# Patient Record
Sex: Female | Born: 1965
Health system: Southern US, Community
[De-identification: ages and names within clinical notes are randomized; demographics above are authoritative.]

## PROBLEM LIST (undated history)

## (undated) DIAGNOSIS — R05 Cough: Secondary | ICD-10-CM

## (undated) DIAGNOSIS — F1010 Alcohol abuse, uncomplicated: Secondary | ICD-10-CM

## (undated) DIAGNOSIS — F32A Depression, unspecified: Secondary | ICD-10-CM

## (undated) DIAGNOSIS — F419 Anxiety disorder, unspecified: Secondary | ICD-10-CM

## (undated) DIAGNOSIS — N200 Calculus of kidney: Secondary | ICD-10-CM

## (undated) DIAGNOSIS — J984 Other disorders of lung: Secondary | ICD-10-CM

## (undated) DIAGNOSIS — M1990 Unspecified osteoarthritis, unspecified site: Secondary | ICD-10-CM

## (undated) HISTORY — PX: RHINOPLASTY: SUR1284

## (undated) HISTORY — PX: NASAL SEPTUM SURGERY: SHX37

## (undated) HISTORY — PX: COLONOSCOPY: SHX174

## (undated) HISTORY — DX: Alcohol abuse, uncomplicated: F10.10

## (undated) HISTORY — DX: Anxiety disorder, unspecified: F41.9

## (undated) HISTORY — DX: Cough: R05

## (undated) HISTORY — DX: Other disorders of lung: J98.4

## (undated) HISTORY — DX: Unspecified osteoarthritis, unspecified site: M19.90

## (undated) HISTORY — DX: Calculus of kidney: N20.0

## (undated) HISTORY — DX: Depression, unspecified: F32.A

---

## 2000-03-19 ENCOUNTER — Other Ambulatory Visit: Admission: RE | Admit: 2000-03-19 | Discharge: 2000-03-19 | Payer: Self-pay | Admitting: Obstetrics & Gynecology

## 2001-01-28 ENCOUNTER — Encounter: Payer: Self-pay | Admitting: Family Medicine

## 2001-01-28 ENCOUNTER — Encounter: Admission: RE | Admit: 2001-01-28 | Discharge: 2001-01-28 | Payer: Self-pay | Admitting: Family Medicine

## 2001-05-04 ENCOUNTER — Other Ambulatory Visit: Admission: RE | Admit: 2001-05-04 | Discharge: 2001-05-04 | Payer: Self-pay | Admitting: Obstetrics and Gynecology

## 2001-05-16 ENCOUNTER — Encounter: Payer: Self-pay | Admitting: Emergency Medicine

## 2001-05-16 ENCOUNTER — Emergency Department (HOSPITAL_COMMUNITY): Admission: EM | Admit: 2001-05-16 | Discharge: 2001-05-16 | Payer: Self-pay | Admitting: Emergency Medicine

## 2002-05-26 ENCOUNTER — Other Ambulatory Visit: Admission: RE | Admit: 2002-05-26 | Discharge: 2002-05-26 | Payer: Self-pay | Admitting: Obstetrics & Gynecology

## 2003-10-21 ENCOUNTER — Other Ambulatory Visit: Admission: RE | Admit: 2003-10-21 | Discharge: 2003-10-21 | Payer: Self-pay | Admitting: Obstetrics and Gynecology

## 2004-01-27 ENCOUNTER — Other Ambulatory Visit: Admission: RE | Admit: 2004-01-27 | Discharge: 2004-01-27 | Payer: Self-pay | Admitting: Obstetrics & Gynecology

## 2004-07-25 ENCOUNTER — Other Ambulatory Visit: Admission: RE | Admit: 2004-07-25 | Discharge: 2004-07-25 | Payer: Self-pay | Admitting: Obstetrics and Gynecology

## 2005-01-04 ENCOUNTER — Ambulatory Visit: Payer: Self-pay | Admitting: Family Medicine

## 2005-02-05 ENCOUNTER — Other Ambulatory Visit: Admission: RE | Admit: 2005-02-05 | Discharge: 2005-02-05 | Payer: Self-pay | Admitting: Obstetrics & Gynecology

## 2005-09-24 ENCOUNTER — Ambulatory Visit: Payer: Self-pay | Admitting: Family Medicine

## 2006-02-03 ENCOUNTER — Ambulatory Visit: Payer: Self-pay | Admitting: Family Medicine

## 2006-10-13 ENCOUNTER — Telehealth (INDEPENDENT_AMBULATORY_CARE_PROVIDER_SITE_OTHER): Payer: Self-pay | Admitting: *Deleted

## 2007-03-25 ENCOUNTER — Ambulatory Visit: Payer: Self-pay | Admitting: Family Medicine

## 2007-06-24 ENCOUNTER — Ambulatory Visit: Payer: Self-pay | Admitting: Family Medicine

## 2007-06-29 ENCOUNTER — Telehealth: Payer: Self-pay | Admitting: Family Medicine

## 2007-07-01 LAB — CONVERTED CEMR LAB
ALT: 11 units/L (ref 0–35)
AST: 13 units/L (ref 0–37)
Albumin: 4.3 g/dL (ref 3.5–5.2)
Alkaline Phosphatase: 44 units/L (ref 39–117)
BUN: 13 mg/dL (ref 6–23)
CO2: 22 meq/L (ref 19–32)
Calcium: 9.3 mg/dL (ref 8.4–10.5)
Chloride: 106 meq/L (ref 96–112)
Cholesterol: 225 mg/dL — ABNORMAL HIGH (ref 0–200)
Creatinine, Ser: 0.68 mg/dL (ref 0.40–1.20)
Glucose, Bld: 90 mg/dL (ref 70–99)
HDL: 65 mg/dL (ref >39)
LDL Cholesterol: 101 mg/dL — ABNORMAL HIGH (ref 0–99)
Potassium: 4.4 meq/L (ref 3.5–5.3)
Sodium: 140 meq/L (ref 135–145)
TSH: 3.36 microintl units/mL (ref 0.350–5.50)
Tissue Transglutaminase Ab, IgA: 0.2 units (ref <7)
Total Bilirubin: 0.4 mg/dL (ref 0.3–1.2)
Total CHOL/HDL Ratio: 3.5
Total Protein: 6.9 g/dL (ref 6.0–8.3)
Triglycerides: 296 mg/dL — ABNORMAL HIGH (ref <150)
VLDL: 59 mg/dL — ABNORMAL HIGH (ref 0–40)

## 2007-07-30 ENCOUNTER — Telehealth: Payer: Self-pay | Admitting: Family Medicine

## 2007-08-07 ENCOUNTER — Ambulatory Visit: Payer: Self-pay | Admitting: Gastroenterology

## 2007-08-25 ENCOUNTER — Encounter: Payer: Self-pay | Admitting: Gastroenterology

## 2007-08-25 ENCOUNTER — Ambulatory Visit: Payer: Self-pay | Admitting: Gastroenterology

## 2007-08-28 ENCOUNTER — Encounter: Payer: Self-pay | Admitting: Gastroenterology

## 2007-12-08 ENCOUNTER — Ambulatory Visit: Payer: Self-pay | Admitting: Family Medicine

## 2007-12-08 ENCOUNTER — Encounter: Admission: RE | Admit: 2007-12-08 | Discharge: 2007-12-08 | Payer: Self-pay | Admitting: Family Medicine

## 2007-12-08 DIAGNOSIS — M25559 Pain in unspecified hip: Secondary | ICD-10-CM | POA: Insufficient documentation

## 2008-04-08 ENCOUNTER — Ambulatory Visit: Payer: Self-pay | Admitting: Family Medicine

## 2008-04-08 DIAGNOSIS — R21 Rash and other nonspecific skin eruption: Secondary | ICD-10-CM

## 2008-04-09 ENCOUNTER — Encounter: Payer: Self-pay | Admitting: Family Medicine

## 2008-04-15 ENCOUNTER — Telehealth: Payer: Self-pay | Admitting: Family Medicine

## 2008-04-25 ENCOUNTER — Telehealth: Payer: Self-pay | Admitting: Family Medicine

## 2008-04-28 ENCOUNTER — Ambulatory Visit: Payer: Self-pay | Admitting: Family Medicine

## 2008-04-28 LAB — CONVERTED CEMR LAB
Basophils Absolute: 0 10*3/uL (ref 0.0–0.1)
Eosinophils Relative: 0 % (ref 0–5)
HCT: 36.5 % (ref 36.0–46.0)
Lymphocytes Relative: 16 % (ref 12–46)
Neutrophils Relative %: 76 % (ref 43–77)
Platelets: 182 10*3/uL (ref 150–400)
RDW: 12.5 % (ref 11.5–15.5)
WBC: 6.5 10*3/uL (ref 4.0–10.5)

## 2008-05-02 ENCOUNTER — Telehealth: Payer: Self-pay | Admitting: Family Medicine

## 2008-06-10 ENCOUNTER — Telehealth (INDEPENDENT_AMBULATORY_CARE_PROVIDER_SITE_OTHER): Payer: Self-pay | Admitting: *Deleted

## 2008-06-13 ENCOUNTER — Ambulatory Visit: Payer: Self-pay | Admitting: Family Medicine

## 2008-06-13 DIAGNOSIS — F1010 Alcohol abuse, uncomplicated: Secondary | ICD-10-CM

## 2008-06-13 HISTORY — DX: Alcohol abuse, uncomplicated: F10.10

## 2008-06-14 ENCOUNTER — Telehealth: Payer: Self-pay | Admitting: Family Medicine

## 2008-06-16 ENCOUNTER — Telehealth: Payer: Self-pay | Admitting: Family Medicine

## 2008-07-08 ENCOUNTER — Encounter: Admission: RE | Admit: 2008-07-08 | Discharge: 2008-07-08 | Payer: Self-pay | Admitting: Family Medicine

## 2008-07-08 ENCOUNTER — Ambulatory Visit: Payer: Self-pay | Admitting: Family Medicine

## 2008-07-08 DIAGNOSIS — R05 Cough: Secondary | ICD-10-CM

## 2008-07-08 DIAGNOSIS — R059 Cough, unspecified: Secondary | ICD-10-CM

## 2008-07-08 HISTORY — DX: Cough, unspecified: R05.9

## 2008-07-10 DIAGNOSIS — J984 Other disorders of lung: Secondary | ICD-10-CM

## 2008-07-10 HISTORY — DX: Other disorders of lung: J98.4

## 2008-07-13 ENCOUNTER — Encounter: Admission: RE | Admit: 2008-07-13 | Discharge: 2008-07-13 | Payer: Self-pay | Admitting: Family Medicine

## 2009-02-20 ENCOUNTER — Ambulatory Visit: Payer: Self-pay | Admitting: Family Medicine

## 2009-02-20 DIAGNOSIS — M654 Radial styloid tenosynovitis [de Quervain]: Secondary | ICD-10-CM

## 2009-11-09 ENCOUNTER — Ambulatory Visit: Payer: Self-pay | Admitting: Family Medicine

## 2009-11-09 DIAGNOSIS — M545 Low back pain, unspecified: Secondary | ICD-10-CM | POA: Insufficient documentation

## 2009-11-10 ENCOUNTER — Encounter: Payer: Self-pay | Admitting: Family Medicine

## 2010-03-20 NOTE — Assessment & Plan Note (Signed)
Summary: BAck pain, skin lesion   Vital Signs:  Patient profile:   45 year old female Height:      63 inches Weight:      157 pounds Pulse rate:   83 / minute BP sitting:   121 / 79  (right arm) Cuff size:   regular  Vitals Entered By: Avon Gully CMA, Duncan Dull) (November 09, 2009 10:57 AM) CC: back pain since this am, did some heavy lifting yesterday, spot on the rt dorsum hand x 1 1/2 month itches,denies bleeding has grown bigger   Primary Care Vartan Kerins:  Dr. Nani Gasser  CC:  back pain since this am, did some heavy lifting yesterday, spot on the rt dorsum hand x 1 1/2 month itches, and denies bleeding has grown bigger.  History of Present Illness: back pain since this am, did some heavy lifting yesterday,   spot on the rt dorsum hand x 1 1/2 month itches,denies bleeding has grown bigger. No alleviating or worsening sxs. Hasn't tried any treatments.   Lifting heavy things yesterday. Woke up sore this AM. Pain in the low back. Taking 2 Aleve. Throbbing.  No spasms. No radiation of pain. No old back injuries. This was at home. No fever or urinary sxs. Was with prolonged sitting or standing. No real alleviating sxs.   Current Medications (verified): 1)  Xanax 0.5 Mg  Tabs (Alprazolam) .... Up To Three Times A Day Prn 2)  Ambien Cr 12.5 Mg  Tbcr (Zolpidem Tartrate) .... Take 1 Tab By Mouth At Bedtime 3)  Caltrate 600 1500 Mg  Tabs (Calcium Carbonate) .... Take 1 Tablet By Mouth Once A Day 4)  Angeliq 0.5-1 Mg  Tabs (Drospirenone-Estradiol) .... Take One Tablet By Mouth Once A Day  Allergies (verified): No Known Drug Allergies  Comments:  Nurse/Medical Assistant: The patient's medications and allergies were reviewed with the patient and were updated in the Medication and Allergy Lists. Avon Gully CMA, Duncan Dull) (November 09, 2009 10:58 AM)  Past History:  Past Medical History: Last updated: 06/13/2008 childbirth x 1 Anxiety Disorder insomnia alcohol abuse.     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  Married to [employer] with 3 children.  the 2 kids at home are stepchildren.   Former Smoker- quit 2007 Alcohol use-yes, 2 glasses of wine a day Drug use-no Regular exercise-yes  Physical Exam  General:  Well-developed,well-nourished,in no acute distress; alert,appropriate and cooperative throughout examination Lungs:  Normal respiratory effort, chest expands symmetrically. Lungs are clear to auscultation, no crackles or wheezes. Heart:  Normal rate and regular rhythm. S1 and S2 normal without gallop, murmur, click, rub or other extra sounds. Msk:  Dec flexion. Normal extension. Normal rotation right and left with side bendind.  Mildy tender over lumbar spine and lumbar paraspinous muscles. No SI joint tenderness.  Skin:  Left dorsume of hand wiht a 1 cm pink papular lesion. well demarcated. No scale or breaks in the skin. Smooth lesion.    Impression & Recommendations:  Problem # 1:  BACK PAIN, LUMBAR (ICD-724.2) Dsicussed likely muscle spasm.  Tral of Aleve, muscle relaxer and as needed tramadol. Als given HO on low back exercises.. If not better in 2-3 weeks f/u in office.  Her updated medication list for this problem includes:    Skelaxin 800 Mg Tabs (Metaxalone) .Marland Kitchen... Take 1 tablet by mouth three times a day as needed muscle spasm.    Tramadol Hcl 50 Mg Tabs (Tramadol hcl) .Marland Kitchen... Take 1  tablet by mouth three times a day as needed for severe pain  Problem # 2:  SKIN RASH (ICD-782.1) KOH done today to rule out fungal cause. If neg treat with topical steoroid adn if not resolving in 2 weeks rec bx.  Orders: T-KOH Prep Fungal ([id number])  Complete Medication List: 1)  Xanax 0.5 Mg Tabs (Alprazolam) .... Up to three times a day prn 2)  Ambien Cr 12.5 Mg Tbcr (Zolpidem tartrate) .... Take 1 tab by mouth at bedtime 3)  Caltrate 600 1500 Mg Tabs (Calcium carbonate) .... Take 1 tablet by mouth once a  day 4)  Angeliq 0.5-1 Mg Tabs (Drospirenone-estradiol) .... Take one tablet by mouth once a day 5)  Skelaxin 800 Mg Tabs (Metaxalone) .... Take 1 tablet by mouth three times a day as needed muscle spasm. 6)  Tramadol Hcl 50 Mg Tabs (Tramadol hcl) .... Take 1 tablet by mouth three times a day as needed for severe pain  Patient Instructions: 1)  Start exercises this weekend 2)  Can use the muslce relaxer and tramadol for pain but still continue your aleve 2 tab by mouth two times a day with food for inflammation. Call if not better in 2-3 weeks.  Prescriptions: TRAMADOL HCL 50 MG TABS (TRAMADOL HCL) Take 1 tablet by mouth three times a day as needed for severe pain  #45 x 0   Entered and Authorized by:   Nani Gasser MD   Signed by:   Nani Gasser MD on 11/09/2009   Method used:   Electronically to        [id number]* (retail)       [address]       [place], [place]  [address]       Ph: [phone]       Fax: [phone]   RxID:   [id number] SKELAXIN 800 MG TABS (METAXALONE) Take 1 tablet by mouth three times a day as needed muscle spasm.  #30 x 0   Entered and Authorized by:   Nani Gasser MD   Signed by:   Nani Gasser MD on 11/09/2009   Method used:   Electronically to        [id number]* (retail)       [address]       [place], [place]  [address]       Ph: [phone]       Fax: [phone]   RxID:   [id number]

## 2010-03-20 NOTE — Assessment & Plan Note (Signed)
Summary: URI, Dequervains TEnosynovitis   Vital Signs:  Patient profile:   45 year old female Height:      63 inches Weight:      159 pounds Temp:     98.5 degrees F oral Pulse rate:   80 / minute BP sitting:   114 / 73  (left arm) Cuff size:   large  Vitals Entered By: Kathlene November (February 20, 2009 4:17 PM) CC: sore throat and dry cough since Friday   Primary Care Provider:  Dr. Nani Gasser  CC:  sore throat and dry cough since Friday.  History of Present Illness: sore throat and dry cough since Friday.  No fever or sinus sxs.  No GI sxs. ST is maybe a little better today.  No SOB.  Has tried some nasal saline. Went home from work early today.  No ansal congestion.   Right thumb pain for about 1 month., Previously dx wiht Dequervains on the left and has had a steroid injection. Wears her splint occ but now with signs on the right. Feel exactly the same. Worse if reaches out to lift something and pain is on the wrist at the radial side. No swelling or redness.    Current Medications (verified): 1)  Xanax 0.5 Mg  Tabs (Alprazolam) .... Up To Three Times A Day Prn 2)  Ambien Cr 12.5 Mg  Tbcr (Zolpidem Tartrate) .... Take 1 Tab By Mouth At Bedtime 3)  Caltrate 600 1500 Mg  Tabs (Calcium Carbonate) .... Take 1 Tablet By Mouth Once A Day 4)  Angeliq 0.5-1 Mg  Tabs (Drospirenone-Estradiol) .... Take One Tablet By Mouth Once A Day  Allergies (verified): No Known Drug Allergies  Comments:  Nurse/Medical Assistant: The patient's medications and allergies were reviewed with the patient and were updated in the Medication and Allergy Lists. Kathlene November (February 20, 2009 4:18 PM)  Physical Exam  General:  Well-developed,well-nourished,in no acute distress; alert,appropriate and cooperative throughout examination Head:  Normocephalic and atraumatic without obvious abnormalities. No apparent alopecia or balding. Eyes:  No corneal or conjunctival inflammation noted. EOMI. Perrla.    Ears:  External ear exam shows no significant lesions or deformities.  Otoscopic examination reveals clear canals, tympanic membranes are intact bilaterally without bulging, retraction, inflammation or discharge. Hearing is grossly normal bilaterally. Nose:  External nasal examination shows no deformity or inflammation. Nasal mucosa are pink and moist without lesions or exudates. Mouth:  Oral mucosa and oropharynx without lesions or exudates.  Teeth in good repair. Neck:  No deformities, masses, or tenderness noted. Lungs:  Normal respiratory effort, chest expands symmetrically. Lungs are clear to auscultation, no crackles or wheezes. Heart:  Normal rate and regular rhythm. S1 and S2 normal without gallop, murmur, click, rub or other extra sounds. Msk:  Left wrist with NROM. Pain with dequervains sign.  Tnder over the radial aspect of the wrist.  Sterngth 5/5 in teh thumb. Nontender in the other thumb joints.  Pulses:  Radial 2+ on the left.  Skin:  no rashes.   Cervical Nodes:  No lymphadenopathy noted Psych:  Cognition and judgment appear intact. Alert and cooperative with normal attention span and concentration. No apparent delusions, illusions, hallucinations   Impression & Recommendations:  Problem # 1:  URI (ICD-465.9) sterp neg. Call if not better in one week and will terat with an ABX.   Orders: Rapid Strep ([id number])  Problem # 2:  DE QUERVAIN'S TENOSYNOVITIS (ICD-727.04) Since has had signs in both hands  can consider checking for inflammation, gout, and thyroid d/o.  PT can go to the lab any time.  Injected for pain relief today and splint given. Wear splint consistantly for 2 weeks. If not improvin then please call and will refer to ortho.  Orders: T-TSH [phone]) T-Sed Rate (Automated) [phone]) T-Uric Acid (Blood) [phone]) Wrist/Thumb Spica (V7846) Injection, Tendon / Ligament ([id number])  Complete Medication List: 1)  Xanax 0.5 Mg Tabs (Alprazolam) .... Up to three  times a day prn 2)  Ambien Cr 12.5 Mg Tbcr (Zolpidem tartrate) .... Take 1 tab by mouth at bedtime 3)  Caltrate 600 1500 Mg Tabs (Calcium carbonate) .... Take 1 tablet by mouth once a day 4)  Angeliq 0.5-1 Mg Tabs (Drospirenone-estradiol) .... Take one tablet by mouth once a day   Procedure Note  Injections:    Comment: Lidocaine 1% w/o epi [id number]  Exp. 12/2010 Kenalog40mg  M#[id number]  Exp. 3/12  Laboratory Results  Date/Time Received: 02/20/2009 Date/Time Reported: 02/20/2009  Other Tests  Rapid Strep: negative

## 2010-04-26 ENCOUNTER — Encounter: Payer: Self-pay | Admitting: Family Medicine

## 2010-04-26 ENCOUNTER — Ambulatory Visit (INDEPENDENT_AMBULATORY_CARE_PROVIDER_SITE_OTHER): Payer: Private Health Insurance - Indemnity | Admitting: Family Medicine

## 2010-04-26 DIAGNOSIS — R197 Diarrhea, unspecified: Secondary | ICD-10-CM | POA: Insufficient documentation

## 2010-04-27 LAB — CONVERTED CEMR LAB
AST: 15 units/L (ref 0–37)
Albumin: 4.7 g/dL (ref 3.5–5.2)
Alkaline Phosphatase: 62 units/L (ref 39–117)
Basophils Relative: 0 % (ref 0–1)
Eosinophils Absolute: 0.4 10*3/uL (ref 0.0–0.7)
Lymphs Abs: 2.1 10*3/uL (ref 0.7–4.0)
MCHC: 33.3 g/dL (ref 30.0–36.0)
MCV: 102.5 fL — ABNORMAL HIGH (ref 78.0–100.0)
Neutro Abs: 9.1 10*3/uL — ABNORMAL HIGH (ref 1.7–7.7)
Neutrophils Relative %: 73 % (ref 43–77)
Platelets: 310 10*3/uL (ref 150–400)
Potassium: 4.2 meq/L (ref 3.5–5.3)
Sodium: 139 meq/L (ref 135–145)
Total Protein: 7.1 g/dL (ref 6.0–8.3)
WBC: 12.5 10*3/uL — ABNORMAL HIGH (ref 4.0–10.5)

## 2010-04-27 LAB — CBC WITH DIFFERENTIAL/PLATELET
Eosinophils Relative: 3 % (ref 0–5)
HCT: 41.1 % (ref 36.0–46.0)
Hemoglobin: 13.7 g/dL (ref 12.0–15.0)
Lymphocytes Relative: 17 % (ref 12–46)
Lymphs Abs: 2.1 10*3/uL (ref 0.7–4.0)
MCV: 102.5 fL — ABNORMAL HIGH (ref 78.0–100.0)
Monocytes Absolute: 0.9 10*3/uL (ref 0.1–1.0)
Monocytes Relative: 7 % (ref 3–12)
RBC: 4.01 MIL/uL (ref 3.87–5.11)
WBC: 12.5 10*3/uL — ABNORMAL HIGH (ref 4.0–10.5)

## 2010-04-27 LAB — COMPREHENSIVE METABOLIC PANEL
ALT: 11 U/L (ref 0–35)
CO2: 18 mEq/L — ABNORMAL LOW (ref 19–32)
Calcium: 9.7 mg/dL (ref 8.4–10.5)
Chloride: 104 mEq/L (ref 96–112)
Creat: 0.7 mg/dL (ref 0.40–1.20)

## 2010-04-27 LAB — LIPASE: Lipase: 13 U/L (ref 0–75)

## 2010-04-27 LAB — GLIADIN ANTIBODIES, SERUM: Gliadin IgG: 3.7 U/mL (ref <20)

## 2010-04-27 LAB — AMYLASE: Amylase: 21 U/L (ref 0–105)

## 2010-04-30 ENCOUNTER — Encounter: Payer: Self-pay | Admitting: Family Medicine

## 2010-05-01 NOTE — Assessment & Plan Note (Signed)
Summary:  chronic diarrhea   Vital Signs:  Patient profile:   45 year old female Height:      63 inches Weight:      154 pounds Pulse rate:   86 / minute BP sitting:   125 / 83  (right arm) Cuff size:   regular  Vitals Entered By: Avon Gully CMA, Duncan Dull) (April 26, 2010 3:09 PM) CC: constant diarrhea   Primary Care Provider:  Dr. Nani Gasser  CC:  constant diarrhea.  History of Present Illness: Havinga a colonoscopy  in 2009 that was normal.  Having constant diarrhea for several years. Feels like she eats adn then has to have a BM.  Never constipated. Occ cup of coffe in the AM.  Wine 2-3 gasses at night.  Has tried immodium AD daily.  this has not been helping she is not currently using any other medications such as Pepto-Bismol.  No blood int eh stool. Has about 7-10 bowel  movements a day.   there mostly loose occasionally watery. she must never has normal stools and denies ever having constipation. she says sometimes the diarrhea so bad that she actually limits a day or 2 of work. Infection is missed the last 2 days of work. she does not have any significant abdominal pain with this. She does get some occasional bloating. she does occasionally drink coffee but not on a daily basis. No other caffeine intake. She denies taking any NSAIDs on a regular basis no prior history of pancreatitis. She does drink 3 glasses of wine in the evening. she does have her gallbladder. no family history of inflammatory bowel disease. note her father does have a history of colon cancer at age 21.  Current Medications (verified): 1)  Xanax 0.5 Mg  Tabs (Alprazolam) .... Up To Three Times A Day Prn 2)  Ambien Cr 12.5 Mg  Tbcr (Zolpidem Tartrate) .... Take 1 Tab By Mouth At Bedtime 3)  Caltrate 600 1500 Mg  Tabs (Calcium Carbonate) .... Take 1 Tablet By Mouth Once A Day 4)  Angeliq 0.5-1 Mg  Tabs (Drospirenone-Estradiol) .... Take One Tablet By Mouth Once A Day  Allergies (verified): No Known  Drug Allergies  Comments:  Nurse/Medical Assistant: The patient's medications and allergies were reviewed with the patient and were updated in the Medication and Allergy Lists. 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  the 2 kids at home are stepchildren.   Former Smoker- quit 2007 Alcohol use-yes, 2 glasses of wine a day Drug use-no Regular exercise-yes  Physical Exam  General:  Well-developed,well-nourished,in no acute distress; alert,appropriate and cooperative throughout examination Head:  Normocephalic and atraumatic without obvious abnormalities. No apparent alopecia or balding. Heart:  Normal rate and regular rhythm. S1 and S2 normal without gallop, murmur, click, rub or other extra sounds. Abdomen:  Bowel sounds positive,abdomen soft and non-tender without masses, organomegaly or hernias noted.   Impression & Recommendations:  Problem # 1:  DIARRHEA (ICD-787.91)  she does have chronic diarrhea. I did  give her prescription for Lomotil but explained this cannot be used on a daily basis. It can be used when necessary. We will certainly check her thyroid and rule out hyper thyroidism. I will also check a CBC and make sure there is no sign of anemia or blood loss. I would also like to check an amylase and lipase may choose to normal. She could have low-grade chronic pancreatitis but she does not have any significant abdominal pain. I will also check for creatinine antibiotics to rule out  gluten sensitivity as well as lactose intolerance. If these are negative she could certainly consider a trial of lactose-free or gluten free diet for 3-4 weeks at a time to see if this significantly improves her symptoms. the other option would be for her to go back and see gastroenterology for another evaluation Her updated medication list for this problem includes:    Diphenoxylate-atropine 2.5-0.025 Mg Tabs (Diphenoxylate-atropine) .Marland Kitchen... 1-2 tabs by mouth up to two times a day  Orders: T-TSH ([id number]) T-CBC w/Diff [phone]) T-Amylase [phone]) T-Lipase [phone]) T-Gliadin Peptide Antibodies, IgG, LgA ([id number]) T- * Misc. Laboratory test [id number]) T-Comprehensive Metabolic Panel [phone])  Complete Medication List: 1)  Xanax 0.5 Mg Tabs (Alprazolam) .... Up to three times a day prn 2)  Ambien Cr 12.5 Mg Tbcr (Zolpidem tartrate) .... Take 1 tab by mouth at bedtime 3)  Caltrate 600 1500 Mg Tabs (Calcium carbonate) .... Take 1 tablet by mouth once a day 4)  Angeliq 0.5-1 Mg Tabs (Drospirenone-estradiol) .... Take one tablet by mouth once a day 5)  Diphenoxylate-atropine 2.5-0.025 Mg Tabs (Diphenoxylate-atropine) .Marland Kitchen.. 1-2 tabs by mouth up to two times a day  Patient Instructions: 1)  We wil call you with our labs.  Prescriptions: DIPHENOXYLATE-ATROPINE 2.5-0.025 MG TABS (DIPHENOXYLATE-ATROPINE) 1-2 tabs by mouth up to two times a day  #20 x 1   Entered and Authorized by:   Nani Gasser MD   Signed by:   Nani Gasser MD on 04/26/2010   Method used:   Printed then faxed to ...       [facility]* (retail)       [address] [place], [place]  [address]       Ph: [phone]       Fax: [phone]   RxID:   [id number]    Orders Added: 1)  T-TSH [phone] 2)  T-CBC w/Diff [[id number]] 3)  T-Amylase [[id number]] 4)  T-Lipase [[id number]] 5)  T-Gliadin Peptide Antibodies, IgG, LgA [[id number]] 6)  T- * Misc. Laboratory test  [99999] 7)  T-Comprehensive Metabolic Panel [[id number]] 8)  Est. Patient Level IV [61607]

## 2010-08-06 ENCOUNTER — Telehealth: Payer: Self-pay | Admitting: Family Medicine

## 2010-08-06 NOTE — Telephone Encounter (Signed)
Then needs to have dentist call in pain med until can get appt.

## 2010-08-06 NOTE — Telephone Encounter (Signed)
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 Wants vicodin called to her pharm [phone]. Her # [id number] or [id number]. Her [place] this [place]. And made an appt w/the other dentist.

## 2010-08-07 NOTE — Telephone Encounter (Signed)
Advised pt of rec. Pt agreed

## 2010-08-27 ENCOUNTER — Telehealth: Payer: Self-pay | Admitting: Family Medicine

## 2010-08-27 NOTE — Telephone Encounter (Signed)
Call pt: Neg for mild allergy adn negative for celiac dz. Can still try a gluten free diet for one month and see if feel better.  Some people do have it but test negative so can do a trial diet.

## 2010-08-28 NOTE — Telephone Encounter (Signed)
Advised pt of results and rec. 

## 2011-07-01 ENCOUNTER — Other Ambulatory Visit: Payer: Self-pay | Admitting: Obstetrics & Gynecology

## 2011-07-01 DIAGNOSIS — R928 Other abnormal and inconclusive findings on diagnostic imaging of breast: Secondary | ICD-10-CM

## 2011-07-08 ENCOUNTER — Ambulatory Visit
Admission: RE | Admit: 2011-07-08 | Discharge: 2011-07-08 | Disposition: A | Discharge status: Home / Self Care | Payer: Private Health Insurance - Indemnity | Source: Ambulatory Visit | Attending: Obstetrics & Gynecology | Admitting: Obstetrics & Gynecology

## 2011-07-08 DIAGNOSIS — R928 Other abnormal and inconclusive findings on diagnostic imaging of breast: Secondary | ICD-10-CM

## 2011-11-11 ENCOUNTER — Other Ambulatory Visit: Payer: Self-pay | Admitting: Obstetrics and Gynecology

## 2011-11-11 LAB — HM PAP SMEAR: HM Pap smear: NEGATIVE

## 2012-02-26 ENCOUNTER — Ambulatory Visit (INDEPENDENT_AMBULATORY_CARE_PROVIDER_SITE_OTHER): Payer: Private Health Insurance - Indemnity | Admitting: Family Medicine

## 2012-02-26 ENCOUNTER — Encounter: Payer: Self-pay | Admitting: Family Medicine

## 2012-02-26 VITALS — BP 120/85 | HR 89 | Temp 98.1°F | Wt 157.0 lb

## 2012-02-26 DIAGNOSIS — H6092 Unspecified otitis externa, left ear: Secondary | ICD-10-CM

## 2012-02-26 DIAGNOSIS — H60399 Other infective otitis externa, unspecified ear: Secondary | ICD-10-CM

## 2012-02-26 MED ORDER — NEOMYCIN-POLYMYXIN-HC 3.5-10000-1 OT SOLN: 3.0000 [drp] | Freq: Three times a day (TID) | OTIC | Status: AC

## 2012-02-26 NOTE — Progress Notes (Signed)
CC: Martha Richardson is a 47 y.o. female is here for left ear pain   Subjective: HPI:  5 days left ear pain. Worsening on a daily basis. Described as "annoying "in severity. Radiating down the left lateral neck, and just above the left ear. Worse with opening and closing the mouth. Nothing seems to make it better or worse otherwise. Denies drainage, hearing loss, dizziness. Denies insertion of foreign body recently or remotely. Has had some mild nasal congestion no other respiratory complaints. Has had some tenderness in the left neck to palpation. Denies trouble swallowing. Denies recent or remote trauma. No interventions as of yet. Denies fevers, chills, cough, skin changes, scalp changes/discomfort, shortness of breath, nor neck pain otherwise   Review Of Systems Outlined In HPI  Past Medical History  Diagnosis Date  . COUGH, CHRONIC 07/08/2008    Qualifier: Diagnosis of  By: Linford Arnold MD, Santina Evans    . LUNG NODULE 07/10/2008    Qualifier: Diagnosis of  By: Linford Arnold MD, Santina Evans    . ALCOHOL ABUSE 06/13/2008    Qualifier: Diagnosis of  By: Linford Arnold MD, Santina Evans       No family history on file.   History  Substance Use Topics  . Smoking status: Not on file  . Smokeless tobacco: Not on file  . Alcohol Use: Not on file     Objective: Filed Vitals:   02/26/12 0822  BP: 120/85  Pulse: 89  Temp: 98.1 F (36.7 C)    General: Alert and Oriented, No Acute Distress HEENT: Pupils equal, round, reactive to light. Conjunctivae clear.  External ears unremarkable, left canal with trace erythema and edema compared to the right canal which is clear and unremarkable.  Middle ear appears open without effusion. Pink inferior turbinates.  Moist mucous membranes, pharynx without inflammation nor lesions.  Neck supple with shotty left anterior chain lymphadenopathy nor abnormal masses. Scalp surrounding the left ear unremarkable. Unremarkable TMJ mechanics without clicking. Lungs: Clear to  auscultation bilaterally, no wheezing/ronchi/rales.  Comfortable work of breathing. Good air movement. Cardiac: Regular rate and rhythm. Normal S1/S2.  No murmurs, rubs, nor gallops.   Skin: Warm and dry.  Assessment & Plan: Martha Richardson was seen today for left ear pain.  Diagnoses and associated orders for this visit:  Otitis externa, left - neomycin-polymyxin-hydrocortisone (CORTISPORIN) otic solution; Place 3 drops into the left ear 3 (three) times daily. For seven days.  Pain most likely do to mild left otitis externa and resultant lymphadenopathy, ear drops above along with scheduled ibuprofen. Patient declines Toradol shot today.Signs and symptoms requring emergent/urgent reevaluation were discussed with the patient.    Return if symptoms worsen or fail to improve.

## 2012-06-15 ENCOUNTER — Encounter: Payer: Self-pay | Admitting: Physician Assistant

## 2012-06-15 ENCOUNTER — Ambulatory Visit (INDEPENDENT_AMBULATORY_CARE_PROVIDER_SITE_OTHER): Payer: Private Health Insurance - Indemnity | Admitting: Physician Assistant

## 2012-06-15 VITALS — BP 143/91 | HR 92 | Wt 156.0 lb

## 2012-06-15 DIAGNOSIS — S336XXA Sprain of sacroiliac joint, initial encounter: Secondary | ICD-10-CM

## 2012-06-15 DIAGNOSIS — M25559 Pain in unspecified hip: Secondary | ICD-10-CM

## 2012-06-15 DIAGNOSIS — M25552 Pain in left hip: Secondary | ICD-10-CM

## 2012-06-15 DIAGNOSIS — S335XXA Sprain of ligaments of lumbar spine, initial encounter: Secondary | ICD-10-CM

## 2012-06-15 MED ORDER — KETOROLAC TROMETHAMINE 30 MG/ML IJ SOLN
30.0000 mg | Freq: Once | INTRAMUSCULAR | Status: AC
  Administered 2012-06-15: 30 mg via INTRAMUSCULAR

## 2012-06-15 MED ORDER — CYCLOBENZAPRINE HCL 10 MG PO TABS: 10.0000 mg | ORAL_TABLET | Freq: Two times a day (BID) | ORAL | Status: DC | PRN

## 2012-06-15 MED ORDER — TRAMADOL HCL 50 MG PO TABS: 50.0000 mg | ORAL_TABLET | Freq: Four times a day (QID) | ORAL | Status: DC | PRN

## 2012-06-15 NOTE — Patient Instructions (Addendum)
Ibuprofen 800mg  up to to three times a day.  Will give tramadol for pain as needed up to 3 times.  Flexeril up to three times a day.  Alternating heat and ice.   Low Back Sprain with Rehab  A sprain is an injury in which a ligament is torn. The ligaments of the lower back are vulnerable to sprains. However, they are strong and require great force to be injured. These ligaments are important for stabilizing the spinal column. Sprains are classified into three categories. Grade 1 sprains cause pain, but the tendon is not lengthened. Grade 2 sprains include a lengthened ligament, due to the ligament being stretched or partially ruptured. With grade 2 sprains there is still function, although the function may be decreased. Grade 3 sprains involve a complete tear of the tendon or muscle, and function is usually impaired. SYMPTOMS   Severe pain in the lower back.  Sometimes, a feeling of a "pop," "snap," or tear, at the time of injury.  Tenderness and sometimes swelling at the injury site.  Uncommonly, bruising (contusion) within 48 hours of injury.  Muscle spasms in the back. CAUSES  Low back sprains occur when a force is placed on the ligaments that is greater than they can handle. Common causes of injury include:  Performing a stressful act while off-balance.  Repetitive stressful activities that involve movement of the lower back.  Direct hit (trauma) to the lower back. RISK INCREASES WITH:  Contact sports (football, wrestling).  Collisions (major skiing accidents).  Sports that require throwing or lifting (baseball, weightlifting).  Sports involving twisting of the spine (gymnastics, diving, tennis, golf).  Poor strength and flexibility.  Inadequate protection.  Previous back injury or surgery (especially fusion). PREVENTION  Wear properly fitted and padded protective equipment.  Warm up and stretch properly before activity.  Allow for adequate recovery between  workouts.  Maintain physical fitness:  Strength, flexibility, and endurance.  Cardiovascular fitness.  Maintain a healthy body weight. PROGNOSIS  If treated properly, low back sprains usually heal with non-surgical treatment. The length of time for healing depends on the severity of the injury.  RELATED COMPLICATIONS   Recurring symptoms, resulting in a chronic problem.  Chronic inflammation and pain in the low back.  Delayed healing or resolution of symptoms, especially if activity is resumed too soon.  Prolonged impairment.  Unstable or arthritic joints of the low back. TREATMENT  Treatment first involves the use of ice and medicine, to reduce pain and inflammation. The use of strengthening and stretching exercises may help reduce pain with activity. These exercises may be performed at home or with a therapist. Severe injuries may require referral to a therapist for further evaluation and treatment, such as ultrasound. Your caregiver may advise that you wear a back brace or corset, to help reduce pain and discomfort. Often, prolonged bed rest results in greater harm then benefit. Corticosteroid injections may be recommended. However, these should be reserved for the most serious cases. It is important to avoid using your back when lifting objects. At night, sleep on your back on a firm mattress, with a pillow placed under your knees. If non-surgical treatment is unsuccessful, surgery may be needed.  MEDICATION   If pain medicine is needed, nonsteroidal anti-inflammatory medicines (aspirin and ibuprofen), or other minor pain relievers (acetaminophen), are often advised.  Do not take pain medicine for 7 days before surgery.  Prescription pain relievers may be given, if your caregiver thinks they are needed. Use only as  directed and only as much as you need.  Ointments applied to the skin may be helpful.  Corticosteroid injections may be given by your caregiver. These injections  should be reserved for the most serious cases, because they may only be given a certain number of times. HEAT AND COLD  Cold treatment (icing) should be applied for 10 to 15 minutes every 2 to 3 hours for inflammation and pain, and immediately after activity that aggravates your symptoms. Use ice packs or an ice massage.  Heat treatment may be used before performing stretching and strengthening activities prescribed by your caregiver, physical therapist, or athletic trainer. Use a heat pack or a warm water soak. SEEK MEDICAL CARE IF:   Symptoms get worse or do not improve in 2 to 4 weeks, despite treatment.  You develop numbness or weakness in either leg.  You lose bowel or bladder function.  Any of the following occur after surgery: fever, increased pain, swelling, redness, drainage of fluids, or bleeding in the affected area.  New, unexplained symptoms develop. (Drugs used in treatment may produce side effects.) EXERCISES  RANGE OF MOTION (ROM) AND STRETCHING EXERCISES - Low Back Sprain Most people with lower back pain will find that their symptoms get worse with excessive bending forward (flexion) or arching at the lower back (extension). The exercises that will help resolve your symptoms will focus on the opposite motion.  Your physician, physical therapist or athletic trainer will help you determine which exercises will be most helpful to resolve your lower back pain. Do not complete any exercises without first consulting with your caregiver. Discontinue any exercises which make your symptoms worse, until you speak to your caregiver. If you have pain, numbness or tingling which travels down into your buttocks, leg or foot, the goal of the therapy is for these symptoms to move closer to your back and eventually resolve. Sometimes, these leg symptoms will get better, but your lower back pain may worsen. This is often an indication of progress in your rehabilitation. Be very alert to any  changes in your symptoms and the activities in which you participated in the 24 hours prior to the change. Sharing this information with your caregiver will allow him or her to most efficiently treat your condition. These exercises may help you when beginning to rehabilitate your injury. Your symptoms may resolve with or without further involvement from your physician, physical therapist or athletic trainer. While completing these exercises, remember:   Restoring tissue flexibility helps normal motion to return to the joints. This allows healthier, less painful movement and activity.  An effective stretch should be held for at least 30 seconds.  A stretch should never be painful. You should only feel a gentle lengthening or release in the stretched tissue. FLEXION RANGE OF MOTION AND STRETCHING EXERCISES: STRETCH  Flexion, Single Knee to Chest   Lie on a firm bed or floor with both legs extended in front of you.  Keeping one leg in contact with the floor, bring your opposite knee to your chest. Hold your leg in place by either grabbing behind your thigh or at your knee.  Pull until you feel a gentle stretch in your low back. Hold __________ seconds.  Slowly release your grasp and repeat the exercise with the opposite side. Repeat __________ times. Complete this exercise __________ times per day.  STRETCH  Flexion, Double Knee to Chest  Lie on a firm bed or floor with both legs extended in front of you.  Keeping one leg in contact with the floor, bring your opposite knee to your chest.  Tense your stomach muscles to support your back and then lift your other knee to your chest. Hold your legs in place by either grabbing behind your thighs or at your knees.  Pull both knees toward your chest until you feel a gentle stretch in your low back. Hold __________ seconds.  Tense your stomach muscles and slowly return one leg at a time to the floor. Repeat __________ times. Complete this exercise  __________ times per day.  STRETCH  Low Trunk Rotation  Lie on a firm bed or floor. Keeping your legs in front of you, bend your knees so they are both pointed toward the ceiling and your feet are flat on the floor.  Extend your arms out to the side. This will stabilize your upper body by keeping your shoulders in contact with the floor.  Gently and slowly drop both knees together to one side until you feel a gentle stretch in your low back. Hold for __________ seconds.  Tense your stomach muscles to support your lower back as you bring your knees back to the starting position. Repeat the exercise to the other side. Repeat __________ times. Complete this exercise __________ times per day  EXTENSION RANGE OF MOTION AND FLEXIBILITY EXERCISES: STRETCH  Extension, Prone on Elbows   Lie on your stomach on the floor, a bed will be too soft. Place your palms about shoulder width apart and at the height of your head.  Place your elbows under your shoulders. If this is too painful, stack pillows under your chest.  Allow your body to relax so that your hips drop lower and make contact more completely with the floor.  Hold this position for __________ seconds.  Slowly return to lying flat on the floor. Repeat __________ times. Complete this exercise __________ times per day.  RANGE OF MOTION  Extension, Prone Press Ups  Lie on your stomach on the floor, a bed will be too soft. Place your palms about shoulder width apart and at the height of your head.  Keeping your back as relaxed as possible, slowly straighten your elbows while keeping your hips on the floor. You may adjust the placement of your hands to maximize your comfort. As you gain motion, your hands will come more underneath your shoulders.  Hold this position __________ seconds.  Slowly return to lying flat on the floor. Repeat __________ times. Complete this exercise __________ times per day.  RANGE OF MOTION- Quadruped, Neutral  Spine   Assume a hands and knees position on a firm surface. Keep your hands under your shoulders and your knees under your hips. You may place padding under your knees for comfort.  Drop your head and point your tailbone toward the ground below you. This will round out your lower back like an angry cat. Hold this position for __________ seconds.  Slowly lift your head and release your tail bone so that your back sags into a large arch, like an old horse.  Hold this position for __________ seconds.  Repeat this until you feel limber in your low back.  Now, find your "sweet spot." This will be the most comfortable position somewhere between the two previous positions. This is your neutral spine. Once you have found this position, tense your stomach muscles to support your low back.  Hold this position for __________ seconds. Repeat __________ times. Complete this exercise __________ times per day.  STRENGTHENING EXERCISES - Low Back Sprain These exercises may help you when beginning to rehabilitate your injury. These exercises should be done near your "sweet spot." This is the neutral, low-back arch, somewhere between fully rounded and fully arched, that is your least painful position. When performed in this safe range of motion, these exercises can be used for people who have either a flexion or extension based injury. These exercises may resolve your symptoms with or without further involvement from your physician, physical therapist or athletic trainer. While completing these exercises, remember:   Muscles can gain both the endurance and the strength needed for everyday activities through controlled exercises.  Complete these exercises as instructed by your physician, physical therapist or athletic trainer. Increase the resistance and repetitions only as guided.  You may experience muscle soreness or fatigue, but the pain or discomfort you are trying to eliminate should never worsen during  these exercises. If this pain does worsen, stop and make certain you are following the directions exactly. If the pain is still present after adjustments, discontinue the exercise until you can discuss the trouble with your caregiver. STRENGTHENING Deep Abdominals, Pelvic Tilt   Lie on a firm bed or floor. Keeping your legs in front of you, bend your knees so they are both pointed toward the ceiling and your feet are flat on the floor.  Tense your lower abdominal muscles to press your low back into the floor. This motion will rotate your pelvis so that your tail bone is scooping upwards rather than pointing at your feet or into the floor. With a gentle tension and even breathing, hold this position for __________ seconds. Repeat __________ times. Complete this exercise __________ times per day.  STRENGTHENING  Abdominals, Crunches   Lie on a firm bed or floor. Keeping your legs in front of you, bend your knees so they are both pointed toward the ceiling and your feet are flat on the floor. Cross your arms over your chest.  Slightly tip your chin down without bending your neck.  Tense your abdominals and slowly lift your trunk high enough to just clear your shoulder blades. Lifting higher can put excessive stress on the lower back and does not further strengthen your abdominal muscles.  Control your return to the starting position. Repeat __________ times. Complete this exercise __________ times per day.  STRENGTHENING  Quadruped, Opposite UE/LE Lift   Assume a hands and knees position on a firm surface. Keep your hands under your shoulders and your knees under your hips. You may place padding under your knees for comfort.  Find your neutral spine and gently tense your abdominal muscles so that you can maintain this position. Your shoulders and hips should form a rectangle that is parallel with the floor and is not twisted.  Keeping your trunk steady, lift your right hand no higher than your  shoulder and then your left leg no higher than your hip. Make sure you are not holding your breath. Hold this position for __________ seconds.  Continuing to keep your abdominal muscles tense and your back steady, slowly return to your starting position. Repeat with the opposite arm and leg. Repeat __________ times. Complete this exercise __________ times per day.  STRENGTHENING  Abdominals and Quadriceps, Straight Leg Raise   Lie on a firm bed or floor with both legs extended in front of you.  Keeping one leg in contact with the floor, bend the other knee so that your foot can rest flat on  the floor.  Find your neutral spine, and tense your abdominal muscles to maintain your spinal position throughout the exercise.  Slowly lift your straight leg off the floor about 6 inches for a count of 15, making sure to not hold your breath.  Still keeping your neutral spine, slowly lower your leg all the way to the floor. Repeat this exercise with each leg __________ times. Complete this exercise __________ times per day. POSTURE AND BODY MECHANICS CONSIDERATIONS - Low Back Sprain Keeping correct posture when sitting, standing or completing your activities will reduce the stress put on different body tissues, allowing injured tissues a chance to heal and limiting painful experiences. The following are general guidelines for improved posture. Your physician or physical therapist will provide you with any instructions specific to your needs. While reading these guidelines, remember:  The exercises prescribed by your provider will help you have the flexibility and strength to maintain correct postures.  The correct posture provides the best environment for your joints to work. All of your joints have less wear and tear when properly supported by a spine with good posture. This means you will experience a healthier, less painful body.  Correct posture must be practiced with all of your activities, especially  prolonged sitting and standing. Correct posture is as important when doing repetitive low-stress activities (typing) as it is when doing a single heavy-load activity (lifting). RESTING POSITIONS Consider which positions are most painful for you when choosing a resting position. If you have pain with flexion-based activities (sitting, bending, stooping, squatting), choose a position that allows you to rest in a less flexed posture. You would want to avoid curling into a fetal position on your side. If your pain worsens with extension-based activities (prolonged standing, working overhead), avoid resting in an extended position such as sleeping on your stomach. Most people will find more comfort when they rest with their spine in a more neutral position, neither too rounded nor too arched. Lying on a non-sagging bed on your side with a pillow between your knees, or on your back with a pillow under your knees will often provide some relief. Keep in mind, being in any one position for a prolonged period of time, no matter how correct your posture, can still lead to stiffness. PROPER SITTING POSTURE In order to minimize stress and discomfort on your spine, you must sit with correct posture. Sitting with good posture should be effortless for a healthy body. Returning to good posture is a gradual process. Many people can work toward this most comfortably by using various supports until they have the flexibility and strength to maintain this posture on their own. When sitting with proper posture, your ears will fall over your shoulders and your shoulders will fall over your hips. You should use the back of the chair to support your upper back. Your lower back will be in a neutral position, just slightly arched. You may place a small pillow or folded towel at the base of your lower back for  support.  When working at a desk, create an environment that supports good, upright posture. Without extra support, muscles  tire, which leads to excessive strain on joints and other tissues. Keep these recommendations in mind: CHAIR:  A chair should be able to slide under your desk when your back makes contact with the back of the chair. This allows you to work closely.  The chair's height should allow your eyes to be level with the upper part of  your monitor and your hands to be slightly lower than your elbows. BODY POSITION  Your feet should make contact with the floor. If this is not possible, use a foot rest.  Keep your ears over your shoulders. This will reduce stress on your neck and low back. INCORRECT SITTING POSTURES  If you are feeling tired and unable to assume a healthy sitting posture, do not slouch or slump. This puts excessive strain on your back tissues, causing more damage and pain. Healthier options include:  Using more support, like a lumbar pillow.  Switching tasks to something that requires you to be upright or walking.  Talking a brief walk.  Lying down to rest in a neutral-spine position. PROLONGED STANDING WHILE SLIGHTLY LEANING FORWARD  When completing a task that requires you to lean forward while standing in one place for a long time, place either foot up on a stationary 2-4 inch high object to help maintain the best posture. When both feet are on the ground, the lower back tends to lose its slight inward curve. If this curve flattens (or becomes too large), then the back and your other joints will experience too much stress, tire more quickly, and can cause pain. CORRECT STANDING POSTURES Proper standing posture should be assumed with all daily activities, even if they only take a few moments, like when brushing your teeth. As in sitting, your ears should fall over your shoulders and your shoulders should fall over your hips. You should keep a slight tension in your abdominal muscles to brace your spine. Your tailbone should point down to the ground, not behind your body, resulting in  an over-extended swayback posture.  INCORRECT STANDING POSTURES  Common incorrect standing postures include a forward head, locked knees and/or an excessive swayback. WALKING Walk with an upright posture. Your ears, shoulders and hips should all line-up. PROLONGED ACTIVITY IN A FLEXED POSITION When completing a task that requires you to bend forward at your waist or lean over a low surface, try to find a way to stabilize 3 out of 4 of your limbs. You can place a hand or elbow on your thigh or rest a knee on the surface you are reaching across. This will provide you more stability, so that your muscles do not tire as quickly. By keeping your knees relaxed, or slightly bent, you will also reduce stress across your lower back. CORRECT LIFTING TECHNIQUES DO :  Assume a wide stance. This will provide you more stability and the opportunity to get as close as possible to the object which you are lifting.  Tense your abdominals to brace your spine. Bend at the knees and hips. Keeping your back locked in a neutral-spine position, lift using your leg muscles. Lift with your legs, keeping your back straight.  Test the weight of unknown objects before attempting to lift them.  Try to keep your elbows locked down at your sides in order get the best strength from your shoulders when carrying an object.  Always ask for help when lifting heavy or awkward objects. INCORRECT LIFTING TECHNIQUES DO NOT:   Lock your knees when lifting, even if it is a small object.  Bend and twist. Pivot at your feet or move your feet when needing to change directions.  Assume that you can safely pick up even a paperclip without proper posture. Document Released: 02/04/2005 Document Revised: 04/29/2011 Document Reviewed: 05/19/2008 [facility] Patient Information 2013 [place], [place].

## 2012-06-15 NOTE — Progress Notes (Signed)
  Subjective:    Patient ID: Martha Richardson, female    DOB: 1965-11-23, 47 y.o.   MRN: [id number]  HPI Patient presents to the clinic with left hip and back pain. Started last night while dancing in the kitchen. She was twisting and felt and heard a pop on the left side. Pain is 8/10 with movement. Walking makes the worse as well as laying on her left side. Better when not moving and sitting or laying flat. Pain is sharp with no radiation. No bowel or bladder dysfunction. Taking aleve/ice/heating and improving some. She needs a note for work she has a lot of steps to get to her office and scared to take elevator.    Review of Systems     Objective:   Physical Exam  Constitutional: She appears well-developed and well-nourished.  Musculoskeletal:  Limited ROM at waist, side to side, extension/flexion. No pain with palpation over left hip bursa. No pain over spine. paraspinus muscle tightness/tenderness in the lower back left side.Negative straight leg test but did result in pain with maneuver. Strength 5/5 bilateral legs.           Assessment & Plan:  Left hip pain/low back sprain- Ibuprofen 800mg  up to to three times a day.  Will give tramadol for pain as needed up to 3 times.  Flexeril up to three times a day.  Alternating heat and ice.  Gave stretches do as tolerated.  Wrote out for 2 days of work.  Call if not improving.

## 2012-07-29 ENCOUNTER — Encounter: Payer: Self-pay | Admitting: Gastroenterology

## 2012-08-05 ENCOUNTER — Encounter: Payer: Self-pay | Admitting: Gastroenterology

## 2012-11-10 ENCOUNTER — Other Ambulatory Visit: Payer: Private Health Insurance - Indemnity | Admitting: Gastroenterology

## 2013-02-26 ENCOUNTER — Encounter: Payer: Self-pay | Admitting: Family Medicine

## 2013-02-26 ENCOUNTER — Ambulatory Visit (INDEPENDENT_AMBULATORY_CARE_PROVIDER_SITE_OTHER): Payer: Private Health Insurance - Indemnity | Admitting: Family Medicine

## 2013-02-26 VITALS — BP 134/73 | HR 92 | Temp 98.1°F | Ht 63.0 in | Wt 166.0 lb

## 2013-02-26 DIAGNOSIS — F17200 Nicotine dependence, unspecified, uncomplicated: Secondary | ICD-10-CM

## 2013-02-26 DIAGNOSIS — Z720 Tobacco use: Secondary | ICD-10-CM

## 2013-02-26 DIAGNOSIS — M25519 Pain in unspecified shoulder: Secondary | ICD-10-CM

## 2013-02-26 DIAGNOSIS — M25512 Pain in left shoulder: Secondary | ICD-10-CM

## 2013-02-26 MED ORDER — TRAMADOL HCL 50 MG PO TABS: 50.0000 mg | ORAL_TABLET | Freq: Three times a day (TID) | ORAL | Status: DC | PRN

## 2013-02-26 MED ORDER — MELOXICAM 7.5 MG PO TABS: 7.5000 mg | ORAL_TABLET | Freq: Every day | ORAL | Status: DC

## 2013-02-26 NOTE — Patient Instructions (Signed)
Stop the ibuprofen. Start Mobic once a day. Can use the tramadol up to 3 times a day as needed for extra pain relief. If this is not helping then please let me know. Please rest the shoulder this weekend. After this we can go ahead and start stretches on the handout. If you're not improving over the next 2-3 weeks and please let me know.

## 2013-02-26 NOTE — Progress Notes (Signed)
   Subjective:    Patient ID: Martha Richardson, female    DOB: [birth year], 48 y.o.   MRN: [id number]  HPI Left shoulder pain x 2 weeks after had been laying in bed for several days for acute illness. No injury. Radiates down outside of her arm. Using salon pas and using IBU for pain relief.  Not really helping.   Painful ot pick up pillow or hook her bra. Pain starts on top of the shoulder. No neck pain. No swelling or rash.    Tobacco abuse-unfortunately she is smoking again. She was able to successfully quit with Chantix in the past and says she would like to quit again. Review of Systems     Objective:   Physical Exam  Constitutional: She appears well-developed and well-nourished.  Musculoskeletal:  Left shoulder with normal extension. Decreased internal rotation. She's able to reach her low back but has difficulty reaching much higher. She's able to reach across and touch her opposite shoulder. She does have pain at about 120 and is able to fully extend. If he can test is negative. Again she experiences pain but is able to resist with good strength. She is tender just over the lateral edge of the acromium.   Skin: Skin is warm and dry. No erythema.  Psychiatric: She has a normal mood and affect. Her behavior is normal.          Assessment & Plan:  Left shoulder pain - suspect bursitis. Discussed different treatment options. She's very been taking a large amount of ibuprofen without significant relief. Subacraomial bursia  injected. Given h.o on stretches to do on her own at home. Rest the shoulder this weekend and it does we can start doing the stretches. Ice as needed. If not improving over the next 2-3 weeks and please let me know.  Tob abuse - given a sample starter pack of Chantix. Call when due for the continuing back.

## 2013-06-08 ENCOUNTER — Ambulatory Visit (INDEPENDENT_AMBULATORY_CARE_PROVIDER_SITE_OTHER): Payer: Private Health Insurance - Indemnity

## 2013-06-08 ENCOUNTER — Encounter: Payer: Self-pay | Admitting: Family Medicine

## 2013-06-08 ENCOUNTER — Ambulatory Visit (INDEPENDENT_AMBULATORY_CARE_PROVIDER_SITE_OTHER): Payer: Private Health Insurance - Indemnity | Admitting: Family Medicine

## 2013-06-08 VITALS — BP 120/82 | HR 99 | Ht 63.0 in | Wt 165.0 lb

## 2013-06-08 DIAGNOSIS — R0602 Shortness of breath: Secondary | ICD-10-CM

## 2013-06-08 DIAGNOSIS — R609 Edema, unspecified: Secondary | ICD-10-CM

## 2013-06-08 DIAGNOSIS — J8410 Pulmonary fibrosis, unspecified: Secondary | ICD-10-CM

## 2013-06-08 DIAGNOSIS — F17200 Nicotine dependence, unspecified, uncomplicated: Secondary | ICD-10-CM

## 2013-06-08 DIAGNOSIS — Z720 Tobacco use: Secondary | ICD-10-CM | POA: Insufficient documentation

## 2013-06-08 DIAGNOSIS — R600 Localized edema: Secondary | ICD-10-CM

## 2013-06-08 DIAGNOSIS — F1010 Alcohol abuse, uncomplicated: Secondary | ICD-10-CM

## 2013-06-08 LAB — POCT URINALYSIS DIPSTICK
Bilirubin, UA: NEGATIVE
Blood, UA: NEGATIVE
GLUCOSE UA: NEGATIVE
KETONES UA: NEGATIVE
Leukocytes, UA: NEGATIVE
Nitrite, UA: NEGATIVE
Protein, UA: NEGATIVE
Spec Grav, UA: <=1.005
UROBILINOGEN UA: 0.2
pH, UA: 5.5

## 2013-06-08 MED ORDER — VARENICLINE TARTRATE 0.5 MG X 11 & 1 MG X 42 PO MISC: ORAL | Status: DC

## 2013-06-08 NOTE — Progress Notes (Signed)
   Subjective:    Patient ID: Martha Richardson, female    DOB: [birth year], 48 y.o.   MRN: [id number]  HPI Has ben SOB since Christmas. Hard to take a deep breath. Still smoking.  No wheezing or chest discomfort. She's down to 5 cigarettes per day. Did try to use Chantix in January to quit. She did feel it was helpful.  Ankel and foot swelling bilateral since Friday ( 5 days).  It seems to get worse by the evenings but is better in the morning. No chest pain.No new medications.  No trauma or injury. Work routine has not changes. No rashes.  No CP.  Does drink a lot of alchohol.  She's not taking any other over-the-counter medications. No increase in salt in the diet. She's never had this before.  Tob abuse - smoking. Has cut down to 5 cig per day and wants to start Chantix again.   Review of Systems     Objective:   Physical Exam  Constitutional: She is oriented to person, place, and time. She appears well-developed and well-nourished.  HENT:  Head: Normocephalic and atraumatic.  Cardiovascular: Normal rate, regular rhythm and normal heart sounds.   Pulmonary/Chest: Effort normal and breath sounds normal.  Musculoskeletal: She exhibits edema.  Trace pitting edema from mid-tibia to toes.  DP pulses 2+ bilat.   Neurological: She is alert and oriented to person, place, and time.  Skin: Skin is warm and dry.  Psychiatric: She has a normal mood and affect. Her behavior is normal.          Assessment & Plan:  SOB - encouraged her to quit smoking. Pulse ox is normal.  We'll get chest x-ray today as well.  tob abuse - encourage cessation. Will restart Chantix. I'm excited that she's down to 5 cigarettes per day which is great success so far. We'll refill the Chantix.  Bilateral dependent ankle edema-unclear etiology. She denies any major changes in her diet or any over-the-counter medications. No increase in salt intake. Her blood pressure looks fantastic today. We'll check her kidney  function and liver function especially since she drinks about a bottle of wine a day. We'll also check her urine for proteinuria. Check her thyroid and evaluate for anemia. No other signs of congestive heart failure et Karie Sodacetera.  Alcohol abuse-I. think she would definitely benefit from an program. She says she's check into it before and felt like she didn't get in with the group that was there. She lives in a private counseling but it was too costly.

## 2013-06-09 ENCOUNTER — Other Ambulatory Visit: Payer: Self-pay | Admitting: Family Medicine

## 2013-06-09 DIAGNOSIS — R748 Abnormal levels of other serum enzymes: Secondary | ICD-10-CM

## 2013-06-09 LAB — COMPLETE METABOLIC PANEL WITH GFR
ALBUMIN: 4.6 g/dL (ref 3.5–5.2)
ALT: 59 U/L — AB (ref 0–35)
AST: 53 U/L — AB (ref 0–37)
Alkaline Phosphatase: 71 U/L (ref 39–117)
BUN: 10 mg/dL (ref 6–23)
CALCIUM: 9.7 mg/dL (ref 8.4–10.5)
CHLORIDE: 104 meq/L (ref 96–112)
CO2: 25 meq/L (ref 19–32)
Creat: 0.63 mg/dL (ref 0.50–1.10)
GFR, Est Non African American: >89 mL/min
Glucose, Bld: 98 mg/dL (ref 70–99)
POTASSIUM: 4.8 meq/L (ref 3.5–5.3)
SODIUM: 140 meq/L (ref 135–145)
TOTAL PROTEIN: 7 g/dL (ref 6.0–8.3)
Total Bilirubin: 0.4 mg/dL (ref 0.2–1.2)

## 2013-06-09 LAB — CBC WITH DIFFERENTIAL/PLATELET
BASOS ABS: 0.1 10*3/uL (ref 0.0–0.1)
Basophils Relative: 1 % (ref 0–1)
EOS ABS: 0.2 10*3/uL (ref 0.0–0.7)
EOS PCT: 3 % (ref 0–5)
HEMATOCRIT: 40 % (ref 36.0–46.0)
Hemoglobin: 13.9 g/dL (ref 12.0–15.0)
Lymphocytes Relative: 40 % (ref 12–46)
Lymphs Abs: 2.5 10*3/uL (ref 0.7–4.0)
MCH: 33.9 pg (ref 26.0–34.0)
MCHC: 34.8 g/dL (ref 30.0–36.0)
MCV: 97.6 fL (ref 78.0–100.0)
MONO ABS: 0.6 10*3/uL (ref 0.1–1.0)
Monocytes Relative: 10 % (ref 3–12)
Neutro Abs: 2.9 10*3/uL (ref 1.7–7.7)
Neutrophils Relative %: 46 % (ref 43–77)
PLATELETS: 274 10*3/uL (ref 150–400)
RBC: 4.1 MIL/uL (ref 3.87–5.11)
RDW: 14.5 % (ref 11.5–15.5)
WBC: 6.2 10*3/uL (ref 4.0–10.5)

## 2013-06-09 LAB — TSH: TSH: 2.649 u[IU]/mL (ref 0.350–4.500)

## 2013-06-11 ENCOUNTER — Ambulatory Visit (INDEPENDENT_AMBULATORY_CARE_PROVIDER_SITE_OTHER): Payer: Private Health Insurance - Indemnity

## 2013-06-11 DIAGNOSIS — R748 Abnormal levels of other serum enzymes: Secondary | ICD-10-CM

## 2013-06-11 DIAGNOSIS — R7989 Other specified abnormal findings of blood chemistry: Secondary | ICD-10-CM

## 2013-07-18 ENCOUNTER — Encounter: Payer: Self-pay | Admitting: Gastroenterology

## 2013-12-21 ENCOUNTER — Ambulatory Visit (INDEPENDENT_AMBULATORY_CARE_PROVIDER_SITE_OTHER): Payer: Private Health Insurance - Indemnity | Admitting: Family Medicine

## 2013-12-21 ENCOUNTER — Encounter: Payer: Self-pay | Admitting: Family Medicine

## 2013-12-21 VITALS — BP 131/85 | HR 83 | Temp 98.1°F | Wt 156.0 lb

## 2013-12-21 DIAGNOSIS — F418 Other specified anxiety disorders: Secondary | ICD-10-CM

## 2013-12-21 DIAGNOSIS — M7551 Bursitis of right shoulder: Secondary | ICD-10-CM

## 2013-12-21 MED ORDER — CITALOPRAM HYDROBROMIDE 10 MG PO TABS: 10.0000 mg | ORAL_TABLET | Freq: Every day | ORAL | Status: DC

## 2013-12-21 MED ORDER — HYDROCODONE-ACETAMINOPHEN 5-325 MG PO TABS: 1.0000 | ORAL_TABLET | Freq: Four times a day (QID) | ORAL | Status: DC | PRN

## 2013-12-21 NOTE — Progress Notes (Addendum)
Subjective:    Patient ID: Martha Richardson, female    DOB: 11-May-1965, 48 y.o.   MRN: [id number]  HPI 3 weeks of right shoulder pain.  Using Ibuprofen with no relief.  Thought initially she had slept on her shoulder wrong. Adjusting get better. She says this feels very similar to the bursitis that she had in her left shoulder about 10 months ago. The pain is mostly over the outside of the shoulder. It's worse with raising the shoulder and reaching behind her back. She denies any known injury or trauma that may have aggravated her started her symptoms. She has not tried any heat or ice.  Anxiety-she is currently prescribed alprazolam 0.5 mg that she takes twice a day and has for several years by her OB/GYN. She is now worried that it may be causing some memory problems. She does complain of anxiousness as well as feeling depressed. She had tried Zoloft years ago but caused some insomnia.she does complete filling down depressed more than half the days and feeling tired. She denies any thoughts of harming herself.   Review of Systems     BP 131/85 mmHg  Pulse 83  Temp(Src) 98.1 F (36.7 C)  Wt 156 lb (70.761 kg)    Allergies  Allergen Reactions  . Zoloft [Sertraline Hcl] Other (See Comments)    Insomnia    Past Medical History  Diagnosis Date  . COUGH, CHRONIC 07/08/2008    Qualifier: Diagnosis of  By: Linford ArnoldMetheney MD, Santina Evansatherine    . LUNG NODULE 07/10/2008    Qualifier: Diagnosis of  By: Linford ArnoldMetheney MD, Santina Evansatherine    . ALCOHOL ABUSE 06/13/2008    Qualifier: Diagnosis of  By: Linford ArnoldMetheney MD, Santina Evansatherine      No past surgical history on file.  History   Social History  . Marital Status: Married    Spouse Name: N/A    Number of Children: N/A  . Years of Education: N/A   Occupational History  . Not on file.   Social History Main Topics  . Smoking status: Unknown If Ever Smoked  . Smokeless tobacco: Not on file  . Alcohol Use: Not on file  . Drug Use: Not on file  . Sexual Activity: Not  on file   Other Topics Concern  . Not on file   Social History Narrative    No family history on file.  Outpatient Encounter Prescriptions as of 12/21/2013  Medication Sig  . ALPRAZolam (XANAX) 0.5 MG tablet   . progesterone (PROMETRIUM) 100 MG capsule   . TEMAZEPAM PO Take by mouth.  . citalopram (CELEXA) 10 MG tablet Take 1 tablet (10 mg total) by mouth daily.  Marland Kitchen. HYDROcodone-acetaminophen (NORCO/VICODIN) 5-325 MG per tablet Take 1 tablet by mouth every 6 (six) hours as needed for moderate pain.  . [DISCONTINUED] ALPRAZolam (XANAX) 0.5 MG tablet Take 0.5 mg by mouth.  . [DISCONTINUED] meloxicam (MOBIC) 7.5 MG tablet Take 1 tablet (7.5 mg total) by mouth daily.  . [DISCONTINUED] varenicline (CHANTIX STARTING MONTH PAK) 0.5 MG X 11 & 1 MG X 42 tablet Take one 0.5 mg tablet by mouth once daily for 3 days, then increase to one 0.5 mg tablet twice daily for 4 days, then increase to one 1 mg tablet twice daily.       Objective:   Physical Exam  Constitutional: She is oriented to person, place, and time. She appears well-developed and well-nourished.  HENT:  Head: Normocephalic and atraumatic.  Musculoskeletal:  Right shoulder  with normal range of motion though she has pain with extension above 90 and with reaching behind her back. Strength is difficult to assess as she was not willing to try very hard because of the pain. She would also not do the MT can test because of discomfort. She is tender along the edge of the acromion.  Neurological: She is alert and oriented to person, place, and time.  Skin: Skin is warm and dry.  Psychiatric: She has a normal mood and affect.          Assessment & Plan:  Right shoulder pain-  Most likely bursitis. Discussed treatment options. She is Re: Using NSAIDs and has not helped. She has not tried heat or ice. She preferred to have an injection today. Please see note below. Patient tolerated well. Encouraged her to ice over the next couple of  days and work on gentle range of motion. Avoid overuse and heavy lifting with that shoulder. Addictive or small quantity of hydrocodone to use over the weekend she is traveling out of town.  Depression/Anxiety - Uncontrolled.  She currently uses a benzodiazepine as chronic therapy for her anxiety. She says that her family members have noticed that she is forgetting things. She wonders if it could be from the medication. Explained that there is a leak especially with chronic use of benzodiazepines and dementia. We discussed using a baseline controller medication. She had tried Zoloft in the past but it caused insomnia. We will try citalopram and I would like to see her back in one month to make sure that she's doing well and to adjust her dose as needed. Encouraged her to start cutting back in weaning down on her on xanax.  PHQ- 9 score of 16, GAD- 7 score 21.  She reports that she is really cut back on her alcohol intake.  Shoulder Injection Procedure Note  Pre-operative Diagnosis: right   Post-operative Diagnosis: same  Indications: Diagnostic and pain relief for bursitis  Anesthesia: cold spray    Procedure Details   Verbal consent was obtained for the procedure. The shoulder was prepped with iodine and the skin was anesthetized. Using a 22 gauge needle the glenohumeral joint is injected with 9  mL 1% lidocaine and 1 mL of triamcinolone (KENALOG) 40mg /ml under the posterior aspect of the acromion. The injection site was cleansed with topical isopropyl alcohol and a dressing was applied.  Complications:  None; patient tolerated the procedure well.

## 2014-01-03 ENCOUNTER — Telehealth: Payer: Self-pay | Admitting: *Deleted

## 2014-01-03 NOTE — Telephone Encounter (Signed)
Pt called and lvm stating that the Celexa has caused her to have blurred vision and dry mouth she doesn't feel as if she can continue this medication. Will forward to Dr. Linford ArnoldMetheney for advise.Loralee PacasBarkley, Leomia Blake [place]Lyn[place]

## 2014-01-03 NOTE — Telephone Encounter (Signed)
Called and lvm informing pt of recommendations.Martha Richardson  

## 2014-01-03 NOTE — Telephone Encounter (Signed)
Ok to stop the [employer]-Dix[employer]edicatin. Can take half tab daily for 5 days adn then stop. See if willing to try something else.

## 2014-03-23 ENCOUNTER — Other Ambulatory Visit: Payer: Self-pay | Admitting: Obstetrics and Gynecology

## 2014-03-25 LAB — CYTOLOGY - PAP

## 2014-08-12 ENCOUNTER — Encounter: Payer: Self-pay | Admitting: Gastroenterology

## 2014-08-19 ENCOUNTER — Encounter: Payer: Self-pay | Admitting: Family Medicine

## 2014-08-19 ENCOUNTER — Ambulatory Visit (INDEPENDENT_AMBULATORY_CARE_PROVIDER_SITE_OTHER): Payer: Private Health Insurance - Indemnity | Admitting: Family Medicine

## 2014-08-19 ENCOUNTER — Ambulatory Visit (INDEPENDENT_AMBULATORY_CARE_PROVIDER_SITE_OTHER): Payer: Private Health Insurance - Indemnity

## 2014-08-19 ENCOUNTER — Telehealth: Payer: Self-pay | Admitting: Family Medicine

## 2014-08-19 VITALS — BP 136/87 | HR 93 | Wt 153.0 lb

## 2014-08-19 DIAGNOSIS — R1310 Dysphagia, unspecified: Secondary | ICD-10-CM

## 2014-08-19 MED ORDER — PREDNISONE 20 MG PO TABS: ORAL_TABLET | ORAL | Status: AC

## 2014-08-19 NOTE — Telephone Encounter (Signed)
Pt.notified

## 2014-08-19 NOTE — Telephone Encounter (Signed)
Sue Lushndrea, Will you please let patient know her xray was normal.  I"ll place an ENT referral and will send prednisone to [facility] that she can start over the weekend.

## 2014-08-19 NOTE — Progress Notes (Signed)
CC: Martha Richardson is a 49 y.o. female is here for lump in throat x 4 days   Subjective: HPI:  Lions 3 days if the sensation that there is a lump in her throat. It's present only when swallowing. It's almost absent though when swallowing foods. Seems to be worse with swallowing liquids. No interventions as of yet. It came on one morning without any warning. Symptoms are mild in severity. She denies difficulty swallowing, choking, shortness of breath, change in voice, unintentional weight loss. She denies any difficulty breathing. No cough. Denies any visible swelling of the site where discomfort is.   Review Of Systems Outlined In HPI  Past Medical History  Diagnosis Date  . COUGH, CHRONIC 07/08/2008    Qualifier: Diagnosis of  By: Linford ArnoldMetheney MD, Santina Evansatherine    . LUNG NODULE 07/10/2008    Qualifier: Diagnosis of  By: Linford ArnoldMetheney MD, Santina Evansatherine    . ALCOHOL ABUSE 06/13/2008    Qualifier: Diagnosis of  By: Linford ArnoldMetheney MD, Santina Evansatherine      No past surgical history on file. No family history on file.  History   Social History  . Marital Status: Married    Spouse Name: N/A  . Number of Children: N/A  . Years of Education: N/A   Occupational History  . Not on file.   Social History Main Topics  . Smoking status: Unknown If Ever Smoked  . Smokeless tobacco: Not on file  . Alcohol Use: Not on file  . Drug Use: Not on file  . Sexual Activity: Not on file   Other Topics Concern  . Not on file   Social History Narrative     Objective: BP 136/87 mmHg  Pulse 93  Wt 153 lb (69.4 kg)  General: Alert and Oriented, No Acute Distress HEENT: Pupils equal, round, reactive to light. Conjunctivae clear. Pink inferior turbinates.  Moist mucous membranes, pharynx without inflammation nor lesions.  Neck supple without palpable lymphadenopathy nor abnormal masses. Lungs: Clear to auscultation bilaterally, no wheezing/ronchi/rales.  Comfortable work of breathing. Good air movement. Extremities: No peripheral  edema.  Strong peripheral pulses.  Mental Status: No depression, anxiety, nor agitation. Skin: Warm and dry.  Assessment & Plan: Martha Richardson was seen today for lump in throat x 4 days.  Diagnoses and all orders for this visit:  Dysphagia Orders: -     DG Neck Soft Tissue; Future   Dysphagia: Plain films were normal and reassuring today. 5 day burst of prednisone if no better follow through with ENT referral has been placed today as well.Signs and symptoms requring emergent/urgent reevaluation were discussed with the patient.   Return if symptoms worsen or fail to improve.

## 2014-08-20 ENCOUNTER — Ambulatory Visit: Payer: Private Health Insurance - Indemnity | Admitting: Family Medicine

## 2015-05-16 ENCOUNTER — Encounter (HOSPITAL_COMMUNITY): Payer: Self-pay

## 2015-05-16 ENCOUNTER — Emergency Department (HOSPITAL_COMMUNITY)
Admission: EM | Admit: 2015-05-16 | Discharge: 2015-05-16 | Disposition: A | Discharge status: Other Acute Inpatient Hospital | Payer: Managed Care, Other (non HMO) | Attending: Emergency Medicine | Admitting: Emergency Medicine

## 2015-05-16 ENCOUNTER — Inpatient Hospital Stay (HOSPITAL_COMMUNITY)
Admission: AD | Admit: 2015-05-16 | Discharge: 2015-05-20 | DRG: 885 | Disposition: A | Discharge status: Home / Self Care | Payer: Managed Care, Other (non HMO) | Source: Intra-hospital | Attending: Psychiatry | Admitting: Psychiatry

## 2015-05-16 ENCOUNTER — Encounter (HOSPITAL_COMMUNITY): Payer: Self-pay | Admitting: Emergency Medicine

## 2015-05-16 DIAGNOSIS — Z8709 Personal history of other diseases of the respiratory system: Secondary | ICD-10-CM | POA: Diagnosis not present

## 2015-05-16 DIAGNOSIS — R45851 Suicidal ideations: Secondary | ICD-10-CM | POA: Diagnosis present

## 2015-05-16 DIAGNOSIS — F1014 Alcohol abuse with alcohol-induced mood disorder: Secondary | ICD-10-CM | POA: Diagnosis present

## 2015-05-16 DIAGNOSIS — F329 Major depressive disorder, single episode, unspecified: Secondary | ICD-10-CM | POA: Diagnosis not present

## 2015-05-16 DIAGNOSIS — Z79899 Other long term (current) drug therapy: Secondary | ICD-10-CM | POA: Insufficient documentation

## 2015-05-16 DIAGNOSIS — G4700 Insomnia, unspecified: Secondary | ICD-10-CM | POA: Diagnosis present

## 2015-05-16 DIAGNOSIS — F17210 Nicotine dependence, cigarettes, uncomplicated: Secondary | ICD-10-CM | POA: Diagnosis present

## 2015-05-16 DIAGNOSIS — F1310 Sedative, hypnotic or anxiolytic abuse, uncomplicated: Secondary | ICD-10-CM | POA: Insufficient documentation

## 2015-05-16 DIAGNOSIS — F10988 Alcohol use, unspecified with other alcohol-induced disorder: Secondary | ICD-10-CM | POA: Diagnosis present

## 2015-05-16 DIAGNOSIS — F332 Major depressive disorder, recurrent severe without psychotic features: Principal | ICD-10-CM | POA: Diagnosis present

## 2015-05-16 DIAGNOSIS — F10230 Alcohol dependence with withdrawal, uncomplicated: Secondary | ICD-10-CM | POA: Insufficient documentation

## 2015-05-16 DIAGNOSIS — F411 Generalized anxiety disorder: Secondary | ICD-10-CM | POA: Insufficient documentation

## 2015-05-16 DIAGNOSIS — F1910 Other psychoactive substance abuse, uncomplicated: Secondary | ICD-10-CM

## 2015-05-16 DIAGNOSIS — F10188 Alcohol abuse with other alcohol-induced disorder: Secondary | ICD-10-CM | POA: Diagnosis present

## 2015-05-16 DIAGNOSIS — F1320 Sedative, hypnotic or anxiolytic dependence, uncomplicated: Secondary | ICD-10-CM | POA: Insufficient documentation

## 2015-05-16 DIAGNOSIS — Y906 Blood alcohol level of 120-199 mg/100 ml: Secondary | ICD-10-CM | POA: Diagnosis present

## 2015-05-16 DIAGNOSIS — F1020 Alcohol dependence, uncomplicated: Secondary | ICD-10-CM | POA: Diagnosis present

## 2015-05-16 DIAGNOSIS — F32A Depression, unspecified: Secondary | ICD-10-CM

## 2015-05-16 LAB — COMPREHENSIVE METABOLIC PANEL
ALBUMIN: 4.8 g/dL (ref 3.5–5.0)
ALK PHOS: 80 U/L (ref 38–126)
ALT: 51 U/L (ref 14–54)
ANION GAP: 10 (ref 5–15)
AST: 60 U/L — ABNORMAL HIGH (ref 15–41)
BUN: 11 mg/dL (ref 6–20)
CALCIUM: 9.7 mg/dL (ref 8.9–10.3)
CHLORIDE: 110 mmol/L (ref 101–111)
CO2: 25 mmol/L (ref 22–32)
Creatinine, Ser: 0.69 mg/dL (ref 0.44–1.00)
GFR calc non Af Amer: >60 mL/min (ref >60)
GLUCOSE: 101 mg/dL — AB (ref 65–99)
Potassium: 4 mmol/L (ref 3.5–5.1)
Sodium: 145 mmol/L (ref 135–145)
Total Bilirubin: 0.3 mg/dL (ref 0.3–1.2)
Total Protein: 7.8 g/dL (ref 6.5–8.1)

## 2015-05-16 LAB — CBC
HEMATOCRIT: 42.6 % (ref 36.0–46.0)
Hemoglobin: 14.6 g/dL (ref 12.0–15.0)
MCH: 34.4 pg — ABNORMAL HIGH (ref 26.0–34.0)
MCHC: 34.3 g/dL (ref 30.0–36.0)
MCV: 100.2 fL — ABNORMAL HIGH (ref 78.0–100.0)
Platelets: 311 10*3/uL (ref 150–400)
RBC: 4.25 MIL/uL (ref 3.87–5.11)
RDW: 12.9 % (ref 11.5–15.5)
WBC: 7.3 10*3/uL (ref 4.0–10.5)

## 2015-05-16 LAB — ETHANOL: Alcohol, Ethyl (B): 149 mg/dL — ABNORMAL HIGH (ref <5)

## 2015-05-16 LAB — SALICYLATE LEVEL

## 2015-05-16 LAB — RAPID URINE DRUG SCREEN, HOSP PERFORMED
AMPHETAMINES: NOT DETECTED
BARBITURATES: NOT DETECTED
BENZODIAZEPINES: POSITIVE — AB
COCAINE: NOT DETECTED
OPIATES: NOT DETECTED
TETRAHYDROCANNABINOL: NOT DETECTED

## 2015-05-16 LAB — ACETAMINOPHEN LEVEL

## 2015-05-16 MED ORDER — LORAZEPAM 1 MG PO TABS: 0.0000 mg | ORAL_TABLET | Freq: Four times a day (QID) | ORAL | Status: DC

## 2015-05-16 MED ORDER — THIAMINE HCL 100 MG/ML IJ SOLN: 100.0000 mg | Freq: Every day | INTRAMUSCULAR | Status: DC

## 2015-05-16 MED ORDER — PROGESTERONE MICRONIZED 100 MG PO CAPS
100.0000 mg | ORAL_CAPSULE | Freq: Every day | ORAL | Status: DC
  Filled 2015-05-16: qty 1

## 2015-05-16 MED ORDER — PAROXETINE HCL 10 MG PO TABS
10.0000 mg | ORAL_TABLET | Freq: Every day | ORAL | Status: DC
  Filled 2015-05-16: qty 1

## 2015-05-16 MED ORDER — NAPROXEN 250 MG PO TABS
250.0000 mg | ORAL_TABLET | Freq: Two times a day (BID) | ORAL | Status: DC | PRN
  Filled 2015-05-16: qty 1

## 2015-05-16 MED ORDER — LORAZEPAM 1 MG PO TABS: 0.0000 mg | ORAL_TABLET | Freq: Two times a day (BID) | ORAL | Status: DC

## 2015-05-16 MED ORDER — VITAMIN B-1 100 MG PO TABS
100.0000 mg | ORAL_TABLET | Freq: Every day | ORAL | Status: DC
  Administered 2015-05-16: 100 mg via ORAL
  Filled 2015-05-16: qty 1

## 2015-05-16 NOTE — ED Notes (Signed)
Report called to RN Brett CanalesSteve, OBS Unit.[facility].  [facility] transport requested.

## 2015-05-16 NOTE — ED Provider Notes (Addendum)
CSN: [id number]     Arrival date & time 05/16/15  1242 History   First MD Initiated Contact with Patient 05/16/15 1501     Chief Complaint  Patient presents with  . Suicidal  . Medical Clearance     HPI Patient presents with depression and suicidal thoughts and substance abuse. She has a history of chronic depression but has not really been seen for before. She has recently been started on Paxil. She states she is depressed for various reasons including family and work issues. States she's also been abusing alcohol. States she'll drink about 3 glasses of wine a day. States she had a frenular drinking too much fluid limit himself to 3 glasses but they have increased the size of her glasses. Denies many medical problems. She states she also will take something to sleep every night and take Xanax 2-3 times a day. Her suicidal plan would be to overdose on her pills.   Past Medical History  Diagnosis Date  . COUGH, CHRONIC 07/08/2008    Qualifier: Diagnosis of  By: Linford Arnold MD, Santina Evans    . LUNG NODULE 07/10/2008    Qualifier: Diagnosis of  By: Linford Arnold MD, Santina Evans    . ALCOHOL ABUSE 06/13/2008    Qualifier: Diagnosis of  By: Linford Arnold MD, Santina Evans     History reviewed. No pertinent past surgical history. History reviewed. No pertinent family history. Social History  Substance Use Topics  . Smoking status: Unknown If Ever Smoked  . Smokeless tobacco: None  . Alcohol Use: None   OB History    No data available     Review of Systems  Constitutional: Negative for activity change and appetite change.  Eyes: Negative for pain.  Respiratory: Negative for chest tightness and shortness of breath.   Cardiovascular: Negative for chest pain and leg swelling.  Gastrointestinal: Negative for nausea, vomiting, abdominal pain and diarrhea.  Genitourinary: Negative for flank pain.  Musculoskeletal: Negative for back pain and neck stiffness.  Skin: Negative for rash.  Neurological: Negative for  weakness, numbness and headaches.  Psychiatric/Behavioral: Positive for suicidal ideas. Negative for behavioral problems.      Allergies  Zoloft  Home Medications   Prior to Admission medications   Medication Sig Start Date End Date Taking? Authorizing Provider  ALPRAZolam Prudy Feeler) 0.5 MG tablet Take 0.5 mg by mouth 3 (three) times daily as needed for anxiety.  11/01/13  Yes Historical Provider, MD  Lubricants (ASTROGLIDE) GEL Apply 1 application topically daily.   Yes Historical Provider, MD  naproxen sodium (ANAPROX) 220 MG tablet Take 220 mg by mouth 2 (two) times daily as needed (headache).   Yes Historical Provider, MD  PARoxetine (PAXIL) 10 MG tablet TK 1 T PO QD 05/09/15  Yes Historical Provider, MD  progesterone (PROMETRIUM) 100 MG capsule Take 100 mg by mouth daily.  12/10/13  Yes Historical Provider, MD  temazepam (RESTORIL) 30 MG capsule TK ONE C PO HS 03/27/15  Yes Historical Provider, MD   BP 130/87 mmHg  Pulse 79  Temp(Src) 98.2 F (36.8 C) (Oral)  Resp 19  SpO2 96% Physical Exam  Constitutional: She is oriented to person, place, and time. She appears well-developed and well-nourished.  HENT:  Head: Normocephalic and atraumatic.  Eyes: Pupils are equal, round, and reactive to light.  Neck: Normal range of motion.  Cardiovascular: Normal rate and regular rhythm.   Pulmonary/Chest: Effort normal and breath sounds normal. No respiratory distress.  Abdominal: Soft. Bowel sounds are normal. She exhibits no  distension. There is no tenderness.  Musculoskeletal: Normal range of motion.  Neurological: She is alert and oriented to person, place, and time.  Skin: Skin is warm and dry.  Psychiatric: Her speech is normal.  Patient is tearful  Nursing note and vitals reviewed.   ED Course  Procedures (including critical care time) Labs Review Labs Reviewed  COMPREHENSIVE METABOLIC PANEL - Abnormal; Notable for the following:    Glucose, Bld 101 (*)    AST 60 (*)    All  other components within normal limits  ETHANOL - Abnormal; Notable for the following:    Alcohol, Ethyl (B) 149 (*)    All other components within normal limits  ACETAMINOPHEN LEVEL - Abnormal; Notable for the following:    Acetaminophen (Tylenol), Serum <10 (*)    All other components within normal limits  CBC - Abnormal; Notable for the following:    MCV 100.2 (*)    MCH 34.4 (*)    All other components within normal limits  URINE RAPID DRUG SCREEN, HOSP PERFORMED - Abnormal; Notable for the following:    Benzodiazepines POSITIVE (*)    All other components within normal limits  SALICYLATE LEVEL    Imaging Review No results found. I have personally reviewed and evaluated these images and lab results as part of my medical decision-making.   EKG Interpretation None      MDM   Final diagnoses:  Depression  Suicidal ideations  Substance abuse    Patient presents with depression and suicidal thoughts. States she wants to know what it's like to be off all of her medicines. States she doesn't know the last time she hadn't had any substances in her. Mostly sleeping pills and alcohol. Some suicidal thoughts. Appears medically cleared at this time and will be seen by TTS.    Benjiman CoreNathan Yeni Jiggetts, MD 05/16/15 1650  [facility] Dr Lucianne MussKumar for Obs.  Benjiman CoreNathan Neviah Braud, MD 05/16/15 [phone]

## 2015-05-16 NOTE — ED Notes (Signed)
Pt AAO x 3, eval by TTS Jovea. No distress noted, calm & cooperative, monitoring for safety, Q 15 min checks in effect.

## 2015-05-16 NOTE — BH Assessment (Signed)
Patient has been accepted to observation bed 6.  Call [phone] for report.  Patient to be transported by [facility] once report has been called.   Davina PokeJoVea Charlton Boule, LCSW Therapeutic Triage Specialist [facility] 05/16/2015 10:00 PM

## 2015-05-16 NOTE — ED Notes (Signed)
Bed: [facility]37 Expected date:  Expected time:  Means of arrival:  Comments: Triage 4  

## 2015-05-16 NOTE — BH Assessment (Addendum)
Consulted with Donell SievertSpencer Simon, PA-C who recommends treatment in the observation unit at this time. Informed EDP of disposition. [facility]   Davina PokeJoVea Long Brimage, LCSW Therapeutic Triage Specialist [facility] 05/16/2015 9:33 PM

## 2015-05-16 NOTE — ED Notes (Addendum)
Pt states her PCP sent her here for depression and substance abuse with suicidal thoughts. When asked about a plan, patient states she would probably take all the medications in her house. Denies homicidal ideation. When asked about her plan patient began crying. Denies any physical pain/complaint. Patient worried about her dependence on tonazepam, xanax, and paxil to sleep and maintain her mood. States she does abuse alcohol, and she's "just tired of having to wake up and take these meds to be normal." Says her job is also very stressful.

## 2015-05-16 NOTE — BH Assessment (Signed)
EDP informed of patient acceptance.  Patient reviewed and signed voluntary consent to treat and ROI to her husband. Patient denies any questions or concerns at this time.  [facility] provided to patients nurse [facility].   Davina PokeJoVea Lathyn Griggs, LCSW Therapeutic Triage Specialist [facility] 05/16/2015 10:32 PM

## 2015-05-16 NOTE — ED Notes (Signed)
Pt oriented to room and unit.  Pt appears very depressed but contracts for safety.  Pt is alert and oriented.  She denies H/I and AVH.  15 minute checks and video monitoring in place.
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
## 2015-05-17 DIAGNOSIS — G4700 Insomnia, unspecified: Secondary | ICD-10-CM | POA: Diagnosis present

## 2015-05-17 DIAGNOSIS — Y906 Blood alcohol level of 120-199 mg/100 ml: Secondary | ICD-10-CM | POA: Diagnosis present

## 2015-05-17 DIAGNOSIS — F10230 Alcohol dependence with withdrawal, uncomplicated: Secondary | ICD-10-CM | POA: Diagnosis not present

## 2015-05-17 DIAGNOSIS — F10188 Alcohol abuse with other alcohol-induced disorder: Secondary | ICD-10-CM | POA: Diagnosis present

## 2015-05-17 DIAGNOSIS — F1020 Alcohol dependence, uncomplicated: Secondary | ICD-10-CM | POA: Diagnosis present

## 2015-05-17 DIAGNOSIS — R45851 Suicidal ideations: Secondary | ICD-10-CM

## 2015-05-17 DIAGNOSIS — F10988 Alcohol use, unspecified with other alcohol-induced disorder: Secondary | ICD-10-CM | POA: Diagnosis present

## 2015-05-17 DIAGNOSIS — F411 Generalized anxiety disorder: Secondary | ICD-10-CM | POA: Insufficient documentation

## 2015-05-17 DIAGNOSIS — F332 Major depressive disorder, recurrent severe without psychotic features: Secondary | ICD-10-CM

## 2015-05-17 DIAGNOSIS — F17210 Nicotine dependence, cigarettes, uncomplicated: Secondary | ICD-10-CM | POA: Diagnosis present

## 2015-05-17 DIAGNOSIS — F1014 Alcohol abuse with alcohol-induced mood disorder: Secondary | ICD-10-CM | POA: Diagnosis present

## 2015-05-17 DIAGNOSIS — F1320 Sedative, hypnotic or anxiolytic dependence, uncomplicated: Secondary | ICD-10-CM | POA: Diagnosis not present

## 2015-05-17 LAB — LIPID PANEL
Cholesterol: 272 mg/dL — ABNORMAL HIGH (ref 0–200)
HDL: 82 mg/dL (ref >40)
LDL CALC: 140 mg/dL — AB (ref 0–99)
TRIGLYCERIDES: 250 mg/dL — AB (ref <150)
Total CHOL/HDL Ratio: 3.3 RATIO
VLDL: 50 mg/dL — ABNORMAL HIGH (ref 0–40)

## 2015-05-17 LAB — TSH: TSH: 5.21 u[IU]/mL — AB (ref 0.350–4.500)

## 2015-05-17 MED ORDER — MAGNESIUM HYDROXIDE 400 MG/5ML PO SUSP: 30.0000 mL | Freq: Every day | ORAL | Status: DC | PRN

## 2015-05-17 MED ORDER — LORAZEPAM 1 MG PO TABS
1.0000 mg | ORAL_TABLET | Freq: Three times a day (TID) | ORAL | Status: AC
  Administered 2015-05-18 – 2015-05-19 (×3): 1 mg via ORAL
  Filled 2015-05-17 (×3): qty 1

## 2015-05-17 MED ORDER — THIAMINE HCL 100 MG/ML IJ SOLN: 100.0000 mg | Freq: Once | INTRAMUSCULAR | Status: DC

## 2015-05-17 MED ORDER — TRAZODONE HCL 50 MG PO TABS
50.0000 mg | ORAL_TABLET | Freq: Every evening | ORAL | Status: DC | PRN
  Administered 2015-05-17 – 2015-05-18 (×2): 50 mg via ORAL
  Filled 2015-05-17 (×8): qty 1

## 2015-05-17 MED ORDER — LOPERAMIDE HCL 2 MG PO CAPS: 2.0000 mg | ORAL_CAPSULE | ORAL | Status: AC | PRN

## 2015-05-17 MED ORDER — ALUM & MAG HYDROXIDE-SIMETH 200-200-20 MG/5ML PO SUSP: 30.0000 mL | ORAL | Status: DC | PRN

## 2015-05-17 MED ORDER — TEMAZEPAM 15 MG PO CAPS
15.0000 mg | ORAL_CAPSULE | Freq: Every day | ORAL | Status: DC
  Administered 2015-05-17 – 2015-05-18 (×2): 15 mg via ORAL
  Filled 2015-05-17 (×2): qty 1

## 2015-05-17 MED ORDER — LORAZEPAM 1 MG PO TABS: 1.0000 mg | ORAL_TABLET | Freq: Every day | ORAL | Status: DC

## 2015-05-17 MED ORDER — VITAMIN B-1 100 MG PO TABS
100.0000 mg | ORAL_TABLET | Freq: Every day | ORAL | Status: DC
  Administered 2015-05-18 – 2015-05-20 (×3): 100 mg via ORAL
  Filled 2015-05-17 (×5): qty 1

## 2015-05-17 MED ORDER — PROGESTERONE MICRONIZED 100 MG PO CAPS
100.0000 mg | ORAL_CAPSULE | Freq: Every day | ORAL | Status: DC
  Administered 2015-05-17 – 2015-05-20 (×4): 100 mg via ORAL
  Filled 2015-05-17 (×6): qty 1

## 2015-05-17 MED ORDER — LORAZEPAM 1 MG PO TABS
1.0000 mg | ORAL_TABLET | Freq: Four times a day (QID) | ORAL | Status: AC
  Administered 2015-05-17 – 2015-05-18 (×6): 1 mg via ORAL
  Filled 2015-05-17 (×5): qty 1

## 2015-05-17 MED ORDER — LORAZEPAM 1 MG PO TABS
1.0000 mg | ORAL_TABLET | Freq: Two times a day (BID) | ORAL | Status: AC
  Administered 2015-05-19 – 2015-05-20 (×2): 1 mg via ORAL
  Filled 2015-05-17 (×3): qty 1

## 2015-05-17 MED ORDER — PAROXETINE HCL 20 MG PO TABS
20.0000 mg | ORAL_TABLET | Freq: Every day | ORAL | Status: DC
  Administered 2015-05-17 – 2015-05-20 (×4): 20 mg via ORAL
  Filled 2015-05-17 (×6): qty 1

## 2015-05-17 MED ORDER — HYDROXYZINE HCL 25 MG PO TABS: 25.0000 mg | ORAL_TABLET | Freq: Four times a day (QID) | ORAL | Status: AC | PRN

## 2015-05-17 MED ORDER — TEMAZEPAM 15 MG PO CAPS
30.0000 mg | ORAL_CAPSULE | Freq: Every day | ORAL | Status: DC
  Administered 2015-05-17: 30 mg via ORAL
  Filled 2015-05-17: qty 2

## 2015-05-17 MED ORDER — LORAZEPAM 1 MG PO TABS
1.0000 mg | ORAL_TABLET | Freq: Four times a day (QID) | ORAL | Status: AC | PRN
  Filled 2015-05-17: qty 1

## 2015-05-17 MED ORDER — ONDANSETRON 4 MG PO TBDP: 4.0000 mg | ORAL_TABLET | Freq: Four times a day (QID) | ORAL | Status: AC | PRN

## 2015-05-17 MED ORDER — ADULT MULTIVITAMIN W/MINERALS CH
1.0000 | ORAL_TABLET | Freq: Every day | ORAL | Status: DC
  Administered 2015-05-17 – 2015-05-20 (×4): 1 via ORAL
  Filled 2015-05-17 (×6): qty 1

## 2015-05-17 MED ORDER — ACETAMINOPHEN 325 MG PO TABS: 650.0000 mg | ORAL_TABLET | Freq: Four times a day (QID) | ORAL | Status: DC | PRN

## 2015-05-17 NOTE — [facility] Group Notes (Signed)
[facility] LCSW Group Therapy 05/17/2015  1:15 PM Type of Therapy: Group Therapy Participation Level: Minimal  Participation Quality: Attentive  Affect: Depressed and Flat  Cognitive: Alert and Oriented  Insight: Developing/Improving and Engaged  Engagement in Therapy: Developing/Improving and Engaged  Modes of Intervention: Clarification, Confrontation, Discussion, Education, Exploration, Limit-setting, Orientation, Problem-solving, Rapport Building, Dance movement psychotherapisteality Testing, Socialization and Support  Summary of Progress/Problems: The topic for group today was emotional regulation. This group focused on both positive and negative emotion identification and allowed group members to process ways to identify feelings, regulate negative emotions, and find healthy ways to manage internal/external emotions. Group members were asked to reflect on a time when their reaction to an emotion led to a negative outcome and explored how alternative responses using emotion regulation would have benefited them. Group members were also asked to discuss a time when emotion regulation was utilized when a negative emotion was experienced. Patient did not participate in discussion despite CSW encouragement.  Samuella BruinKristin Wadie Liew, MSW, LCSW Clinical Social Worker [facility] [phone]

## 2015-05-17 NOTE — Tx Team (Signed)
Initial Interdisciplinary Treatment Plan   PATIENT STRESSORS: Substance abuse   PATIENT STRENGTHS: Ability for insight Average or above average intelligence Capable of independent living Licensed conveyancerCommunication skills Financial means Motivation for treatment/growth Religious Affiliation Supportive family/friends   PROBLEM LIST: Problem List/Patient Goals Date to be addressed Date deferred Reason deferred Estimated date of resolution  Pt use of ETOH "I drink 4-6 glasses of wine every day and need to quit" 05/17/15     Depression/Anxiety " I need to lear how to manage my prescriptions" 05/17/15     Increase in stress " I need to learn how to manage stress better" 05/17/15                                           DISCHARGE CRITERIA:  Ability to meet basic life and health needs Adequate post-discharge living arrangements Improved stabilization in mood, thinking, and/or behavior Medical problems require only outpatient monitoring Motivation to continue treatment in a less acute level of care Verbal commitment to aftercare and medication compliance  PRELIMINARY DISCHARGE PLAN: Attend 12-step recovery group Outpatient therapy Return to previous living arrangement  PATIENT/FAMIILY INVOLVEMENT: This treatment plan has been presented to and reviewed with the patient, Martha Richardson.  The patient has been given the opportunity to ask questions and make suggestions.  Sylvan CheeseSteven M Dangela How 05/17/2015, 2:06 AM

## 2015-05-17 NOTE — Tx Team (Signed)
Interdisciplinary Treatment Plan Update (Adult) Date: 05/17/2015    Time Reviewed: 9:30 AM  Progress in Treatment: Attending groups: Continuing to assess, patient new to milieu Participating in groups: Continuing to assess, patient new to milieu Taking medication as prescribed: Yes Tolerating medication: Yes Family/Significant other contact made: No, CSW assessing for appropriate contacts Patient understands diagnosis: Yes Discussing patient identified problems/goals with staff: Yes Medical problems stabilized or resolved: Yes Denies suicidal/homicidal ideation: Yes Issues/concerns per patient self-inventory: Yes Other:  New problem(s) identified: N/A  Discharge Plan or Barriers:  Patient plans to return home to follow up with outpatient services.  Reason for Continuation of Hospitalization:  Depression Anxiety Medication Stabilization   Comments: N/A  Estimated length of stay: 3-5 days    Patient is a 50 year old female with a diagnosis of Major Depressive Disorder. Pt presented to the hospital with depression, anxiety, and alcohol abuse. Pt reports primary trigger(s) for admission was employment and relationship stressors. Patient will benefit from crisis stabilization, medication evaluation, group therapy and psycho education in addition to case management for discharge planning. At discharge, it is recommended that Pt remain compliant with established discharge plan and continued treatment.   Review of initial/current patient goals per problem list:  1. Goal(s): Patient will participate in aftercare plan   Met: Yes   Target date: 3-5 days post admission date   As evidenced by: Patient will participate within aftercare plan AEB aftercare provider and housing plan at discharge being identified.  3/29: Goal met. Patient plans to return home to follow up with outpatient services.    2. Goal (s): Patient will exhibit decreased depressive symptoms and suicidal  ideations.   Met: No   Target date: 3-5 days post admission date   As evidenced by: Patient will utilize self rating of depression at 3 or below and demonstrate decreased signs of depression or be deemed stable for discharge by MD.  3/29: Patient rates depression at 8 today.   3. Goal(s): Patient will demonstrate decreased signs and symptoms of anxiety.   Met: No   Target date: 3-5 days post admission date   As evidenced by: Patient will utilize self rating of anxiety at 3 or below and demonstrated decreased signs of anxiety, or be deemed stable for discharge by MD  3/29: Patient reports high levels of anxiety today.    4. Goal(s): Patient will demonstrate decreased signs of withdrawal due to substance abuse   Met: Yes   Target date: 3-5 days post admission date   As evidenced by: Patient will produce a CIWA/COWS score of 0, have stable vitals signs, and no symptoms of withdrawal  3/29: Goal met. No withdrawal symptoms reported at this time per medical chart.     Attendees: Patient:    Family:    Physician: Dr. Parke Poisson; Dr. Sabra Heck 05/17/2015 9:30 AM  Nursing: Eulogio Bear, Elesa Massed, Harland German, RN 05/17/2015 9:30 AM  Clinical Social Worker: Tilden Fossa, LCSW 05/17/2015 9:30 AM  Other: Peri Maris, LCSWA; [place], LCSW  05/17/2015 9:30 AM  Other:  05/17/2015 9:30 AM  Other:  05/17/2015 9:30 AM  Other: May Malachi Carl NP 05/17/2015 9:30 AM  Other:    Other:          Scribe for Treatment Team:  Tilden Fossa, [place]
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
## 2015-05-17 NOTE — [facility] Group Notes (Signed)
   [facility]  05/17/2015  8:45 AM   Participation Quality: Alert, Appropriate and Oriented  Mood/Affect: Depressed and anxious  Depression Rating: 8  Anxiety Rating: Reports high anxiety level  Thoughts of Suicide: Pt denies SI/HI  Will you contract for safety? Yes  Current AVH: Pt denies  Plan for Discharge/Comments: Pt attended discharge planning group and actively participated in group. CSW provided pt with today's workbook. Patient was tearful during group and states the reason for her admission is depression, anxiety, getting off her prescription medications. She plans to return home to follow up with outpatient services.   Transportation Means: Pt reports access to transportation  Supports: No supports mentioned at this time  Samuella BruinKristin Grove Defina, MSW, Johnson & JohnsonLCSW Clinical Social Worker [facility] [phone]

## 2015-05-17 NOTE — Progress Notes (Signed)
Patient ID: [place] Martha Richardson, female   DOB: 1965-09-18, 50 y.o.   MRN: [id number] D: Client admitted to adult unit. A: Writer introduced self to client, oriented client to unit and room. Staff will monitor q5815min for safety. R: Client is safe on the unit.

## 2015-05-17 NOTE — Progress Notes (Signed)
Admission Note:  Martha Richardson   Principal Diagnosis: Alcohol Abuse with Alcohol Induced Mood Disorder Diagnosis: GAD, MDD recurrent severe, Benzodiazepine dependence.   Pt is a 50 yo WF admitted to observation unit at 23:00.  Pt is very emotional and crys often and easily.  Pt sts reason for her being here is to be "drug free".  Pt consumes 4-6 glasses of wine each day and will use other ETOH products if available.  Pt crying often in admission stating that she feels helpless to quit drugs.  Pt has hx of vertigo and has been made HIGH FALL RISK.  Pt knows when attack will come on and can take precautions..  Pt has hx of drug abuse and mental illness.  Pt states she is sleepy. Pt given Restoril 30 mg at 01:41.  Pt TRANSFERRED to ADULT UNIT at 01:44 to room 306 bed 2.

## 2015-05-17 NOTE — Progress Notes (Signed)
Recreation Therapy Notes  Date: 03.29.2017 Time: 9:30am Location: 300 Hall Group Room   Group Topic: Stress Management  Goal Area(s) Addresses:  Patient will actively participate in stress management techniques presented during session.   Behavioral Response: Appropriate   Intervention: Stress management techniques  Activity : Diaphragmatic Breathing and Mindful Breathing. LRT provided education, instruction and demonstration on practice of Diaphragmatic Breathing and Mindful Breathing. Patient was asked to participate in technique introduced during session.   Education:  Stress Management, Discharge Planning.   Education Outcome: Acknowledges education  Clinical Observations/Feedback: Patient actively engaged in technique introduced, expressed no concerns and demonstrated ability to practice independently post d/c.   Marykay Lexenise L Semisi Biela, LRT/CTRS        Guillaume Weninger L 05/17/2015 9:50 AM

## 2015-05-17 NOTE — Progress Notes (Signed)
D-  Patient has been isolative to her room for the majority of the shift.  Patient reported feeling anxious and increased depression today.  Patient has been complaining of withdrawal symptoms and feeling tired.  A- Assess patient for safety, offer medications as prescribed, engage patient in 1:1 staff talks,   R- Resident able to contract for safety.

## 2015-05-17 NOTE — [facility] Suicide Risk Assessment (Signed)
[facility]   Nursing information obtained from:    Demographic factors:    Current Mental Status:    Loss Factors:    Historical Factors:    Risk Reduction Factors:     Total Time spent with patient: 45 minutes Principal Problem: <principal problem not specified> Diagnosis:   Patient Active Problem List   Diagnosis Date Noted  . Alcohol abuse with alcohol-induced mental disorder ([facility]) [F10.988] 05/17/2015  . GAD (generalized anxiety disorder) [F41.1]   . Severe episode of recurrent major depressive disorder, without psychotic features ([facility]) [F33.2]   . Benzodiazepine dependence, continuous ([facility]) [F13.20]   . Tobacco abuse [Z72.0] 06/08/2013  . DIARRHEA [R19.7] 04/26/2010  . BACK PAIN, LUMBAR [M54.5] 11/09/2009  . DE QUERVAIN'S TENOSYNOVITIS [M65.4] 02/20/2009  . LUNG NODULE [J98.4] 07/10/2008  . COUGH, CHRONIC [R05] 07/08/2008  . ALCOHOL ABUSE [F10.10] 06/13/2008  . Rash and other nonspecific skin eruption [R21] 04/08/2008  . HIP PAIN, RIGHT [M25.559] 12/08/2007   Subjective Data: see admission H and P  Continued Clinical Symptoms:  Alcohol Use Disorder Identification Test Final Score (AUDIT): 30 The "Alcohol Use Disorders Identification Test", Guidelines for Use in Primary Care, Second Edition.  World Science writerHealth Organization [facility](WHO). Score between 0-7:  no or low risk or alcohol related problems. Score between 8-15:  moderate risk of alcohol related problems. Score between 16-19:  high risk of alcohol related problems. Score 20 or above:  warrants further diagnostic evaluation for alcohol dependence and treatment.   CLINICAL FACTORS:   Depression:   Comorbid alcohol abuse/dependence Alcohol/Substance Abuse/Dependencies  Psychiatric Specialty Exam: ROS  Blood pressure 120/75, pulse 71, temperature 98.1 F (36.7 C), temperature source Oral, resp. rate 16, height 5\' 3"  (1.6 m), weight 69.4 kg (153 lb), SpO2 99 %.Body mass index is 27.11 kg/(m^2).   COGNITIVE FEATURES THAT CONTRIBUTE TO RISK:  Closed-mindedness, Polarized thinking and Thought constriction (tunnel vision)    SUICIDE RISK:   Moderate:  Frequent suicidal ideation with limited intensity, and duration, some specificity in terms of plans, no associated intent, good self-control, limited dysphoria/symptomatology, some risk factors present, and identifiable protective factors, including available and accessible social support.  PLAN OF CARE: see admission H and P  I certify that inpatient services furnished can reasonably be expected to improve the patient's condition.   Rachael FeeLUGO,Sherri Mcarthy A, MD 05/17/2015, 4:38 PM
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
## 2015-05-18 LAB — HEMOGLOBIN A1C
HEMOGLOBIN A1C: 5.4 % (ref 4.8–5.6)
MEAN PLASMA GLUCOSE: 108 mg/dL

## 2015-05-18 NOTE — Progress Notes (Signed)
D-  Patient reported a decrease in her depression and anxiety but reports they are still there.  Patient reports withdrawal symptoms as only sight tremors and a diarrhea. Patient denies SI, HI and AVH.  Patient states desire for sobriety and states upon discharge she plans to attend AA or NA and has the support of her husband and friends.  Patient was compliant with medications and planned to attend all groups this shift.   A- Assess and monitor for safety, engage patient in 1:1 staff talks, offer medications as prescribed.   R-  Patient was able to contract for safety.

## 2015-05-18 NOTE — [facility] Group Notes (Signed)
[facility] LCSW Group Therapy 05/18/2015 1:15 PM Type of Therapy: Group Therapy Participation Level: Active  Participation Quality: Attentive, Sharing and Supportive  Affect: Blunted  Cognitive: Alert and Oriented  Insight: Developing/Improving and Engaged  Engagement in Therapy: Developing/Improving and Engaged  Modes of Intervention: Activity, Clarification, Confrontation, Discussion, Education, Exploration, Limit-setting, Orientation, Problem-solving, Rapport Building, Dance movement psychotherapisteality Testing, Socialization and Support  Summary of Progress/Problems: Patient was attentive and engaged with speaker from [employer]. Patient was attentive to speaker while they shared their story of dealing with mental health and overcoming it. Patient expressed interest in their programs and services and received information on their agency. Patient processed ways they can relate to the speaker.   Martha BruinKristin Rokia Bosket, LCSW Clinical Social Worker [facility] [phone]
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
## 2015-05-18 NOTE — Progress Notes (Signed)
[facility] MD Progress Note  05/18/2015 6:18 PM Martha Richardson  MRN:  [id number] Subjective:  Martha Richardson has continued to detox. She states she is trying to project herself out of here and see the things she needs to address to maintain her abstinence. States home is good her husband is supportive. 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 States her drinking is more about the habit she developed of coming home from work and reaching for the bottle of wine. States she needs to find things she can do in lew of doing this Principal Problem: Severe episode of recurrent major depressive disorder, without psychotic features (HCC) Diagnosis:   Patient Active Problem List   Diagnosis Date Noted  . Alcohol abuse with alcohol-induced mental disorder (HCC) [F10.988] 05/17/2015  . Alcohol dependence (HCC) [F10.20] 05/17/2015  . GAD (generalized anxiety disorder) [F41.1]   . Severe episode of recurrent major depressive disorder, without psychotic features (HCC) [F33.2]   . Benzodiazepine dependence, continuous (HCC) [F13.20]   . Tobacco abuse [Z72.0] 06/08/2013  . DIARRHEA [R19.7] 04/26/2010  . BACK PAIN, LUMBAR [M54.5] 11/09/2009  . DE QUERVAIN'S TENOSYNOVITIS [M65.4] 02/20/2009  . LUNG NODULE [J98.4] 07/10/2008  . COUGH, CHRONIC [R05] 07/08/2008  . ALCOHOL ABUSE [F10.10] 06/13/2008  . Rash and other nonspecific skin eruption [R21] 04/08/2008  . HIP PAIN, RIGHT [M25.559] 12/08/2007   Total Time spent with patient: 20 minutes  Past Psychiatric History: see admission H and P  Past Medical History:  Past Medical History  Diagnosis Date  . COUGH, CHRONIC 07/08/2008    Qualifier: Diagnosis of  By: Linford Arnold MD, Santina Evans    . LUNG NODULE 07/10/2008    Qualifier: Diagnosis of  By: Linford Arnold MD, Santina Evans    . ALCOHOL ABUSE 06/13/2008    Qualifier: Diagnosis of  By: Linford Arnold MD, Santina Evans     History reviewed. No pertinent past surgical  history. Family History: History reviewed. No pertinent family history. Family Psychiatric  History: see admission H and P Social History:  History  Alcohol Use  . 2.4 - 3.6 oz/week  . 4-6 Glasses of wine per week     History  Drug Use No    Social History   Social History  . Marital Status: Married    Spouse Name: N/A  . Number of Children: N/A  . Years of Education: N/A   Social History Main Topics  . Smoking status: Current Some Day Smoker -- 0.25 packs/day    Types: Cigarettes  . Smokeless tobacco: Never Used  . Alcohol Use: 2.4 - 3.6 oz/week    4-6 Glasses of wine per week  . Drug Use: No  . Sexual Activity: Not Asked   Other Topics Concern  . None   Social History Narrative   Additional Social History:    Pain Medications: See PTA Prescriptions: See PTA Over the Counter: See PTA History of alcohol / drug use?: Yes Withdrawal Symptoms: Agitation, Delirium, Sweats, Nausea / Vomiting, Irritability, Tremors Name of Substance 1: Alcohol 1 - Age of First Use: 12 1 - Amount (size/oz): 4-6 glasses of wine 1 - Frequency: daily 1 - Duration: ongoing 1 - Last Use / Amount: today 2 glasses of wine                  Sleep: poor  Appetite:  Fair  Current Medications: Current Facility-Administered Medications  Medication Dose Route Frequency Provider Last Rate Last Dose  . acetaminophen (  TYLENOL) tablet 650 mg  650 mg Oral Q6H PRN Kerry HoughSpencer E Simon, PA-C      . alum & mag hydroxide-simeth (MAALOX/MYLANTA) 200-200-20 MG/5ML suspension 30 mL  30 mL Oral Q4H PRN Kerry HoughSpencer E Simon, PA-C      . hydrOXYzine (ATARAX/VISTARIL) tablet 25 mg  25 mg Oral Q6H PRN Kerry HoughSpencer E Simon, PA-C      . loperamide (IMODIUM) capsule 2-4 mg  2-4 mg Oral PRN Kerry HoughSpencer E Simon, PA-C      . LORazepam (ATIVAN) tablet 1 mg  1 mg Oral Q6H PRN Kerry HoughSpencer E Simon, PA-C      . LORazepam (ATIVAN) tablet 1 mg  1 mg Oral TID Kerry HoughSpencer E Simon, PA-C   1 mg at 05/18/15 1732   Followed by  . [START ON  05/19/2015] LORazepam (ATIVAN) tablet 1 mg  1 mg Oral BID Kerry HoughSpencer E Simon, PA-C       Followed by  . [START ON 05/21/2015] LORazepam (ATIVAN) tablet 1 mg  1 mg Oral Daily Spencer E Simon, PA-C      . magnesium hydroxide (MILK OF MAGNESIA) suspension 30 mL  30 mL Oral Daily PRN Kerry HoughSpencer E Simon, PA-C      . multivitamin with minerals tablet 1 tablet  1 tablet Oral Daily Kerry HoughSpencer E Simon, PA-C   1 tablet at 05/18/15 0848  . ondansetron (ZOFRAN-ODT) disintegrating tablet 4 mg  4 mg Oral Q6H PRN Kerry HoughSpencer E Simon, PA-C      . PARoxetine (PAXIL) tablet 20 mg  20 mg Oral Daily Kerry HoughSpencer E Simon, PA-C   20 mg at 05/18/15 0847  . progesterone (PROMETRIUM) capsule 100 mg  100 mg Oral Daily Kerry HoughSpencer E Simon, PA-C   100 mg at 05/18/15 0847  . temazepam (RESTORIL) capsule 15 mg  15 mg Oral QHS Rachael FeeIrving A Hanley Rispoli, MD   15 mg at 05/17/15 2202  . thiamine (B-1) injection 100 mg  100 mg Intramuscular Once Kerry HoughSpencer E Simon, PA-C   100 mg at 05/17/15 0142  . thiamine (VITAMIN B-1) tablet 100 mg  100 mg Oral Daily Kerry HoughSpencer E Simon, PA-C   100 mg at 05/18/15 0848  . traZODone (DESYREL) tablet 50 mg  50 mg Oral QHS,MR X 1 Kerry HoughSpencer E Simon, PA-C   50 mg at 05/17/15 2202    Lab Results:  Results for orders placed or performed during the hospital encounter of 05/16/15 (from the past 48 hour(s))  TSH     Status: Abnormal   Collection Time: 05/17/15  6:10 AM  Result Value Ref Range   TSH 5.210 (H) 0.350 - 4.500 uIU/mL    Comment: Performed at [facility]  Hemoglobin A1c     Status: None   Collection Time: 05/17/15  6:10 AM  Result Value Ref Range   Hgb A1c MFr Bld 5.4 4.8 - 5.6 %    Comment: (NOTE)         Pre-diabetes: 5.7 - 6.4         Diabetes: >6.4         Glycemic control for adults with diabetes: <7.0    Mean Plasma Glucose 108 mg/dL    Comment: (NOTE) Performed At: [facility] [address] [place], [place] [id number] Mila HomerHancock William F MD NW:[phone]:[phone] Performed at [facility]   Lipid panel     Status: Abnormal   Collection Time: 05/17/15  6:10 AM  Result Value Ref Range   Cholesterol 272 (H) 0 - 200  mg/dL   Triglycerides 161 (H) <150 mg/dL   HDL 82 >09 mg/dL   Total CHOL/HDL Ratio 3.3 RATIO   VLDL 50 (H) 0 - 40 mg/dL   LDL Cholesterol 604 (H) 0 - 99 mg/dL    Comment:        Total Cholesterol/HDL:CHD Risk Coronary Heart Disease Risk Table                     Men   Women  1/2 Average Risk   3.4   3.3  Average Risk       5.0   4.4  2 X Average Risk   9.6   7.1  3 X Average Risk  23.4   11.0        Use the calculated Patient Ratio above and the CHD Risk Table to determine the patient's CHD Risk.        ATP III CLASSIFICATION (LDL):  <100     mg/dL   Optimal  540-981  mg/dL   Near or Above                    Optimal  130-159  mg/dL   Borderline  191-478  mg/dL   High  >295     mg/dL   Very High Performed at [facility]     Blood Alcohol level:  Lab Results  Component Value Date   [facility] 149* 05/16/2015    Physical Findings: AIMS:  , ,  ,  ,    CIWA:  CIWA-Ar Total: 0 COWS:  COWS Total Score: 1  Musculoskeletal: Strength & Muscle Tone: within normal limits Gait & Station: normal Patient leans: normal  Psychiatric Specialty Exam: Review of Systems  Constitutional: Positive for malaise/fatigue.  HENT: Negative.   Eyes: Negative.   Respiratory: Negative.   Cardiovascular: Negative.   Gastrointestinal: Negative.   Genitourinary: Negative.   Musculoskeletal: Positive for back pain and joint pain.  Skin: Negative.   Neurological: Negative.   Endo/Heme/Allergies: Negative.   Psychiatric/Behavioral: Positive for depression and substance abuse. The patient is nervous/anxious.     Blood pressure 108/67, pulse 65, temperature 97.5 F (36.4 C), temperature source Oral, resp. rate 16, height 5\' 3"  (1.6 m), weight 69.4 kg (153 lb), SpO2 99 %.Body mass index is 27.11 kg/(m^2).  General Appearance: Fairly Groomed  Patent attorney::   Fair  Speech:  Clear and Coherent  Volume:  Decreased  Mood:  Anxious  Affect:  Restricted  Thought Process:  Coherent and Goal Directed  Orientation:  Full (Time, Place, and Person)  Thought Content:  symptoms events worries concerns  Suicidal Thoughts:  No  Homicidal Thoughts:  No  Memory:  Immediate;   Fair Recent;   Fair Remote;   Fair  Judgement:  Fair  Insight:  Present  Psychomotor Activity:  Normal  Concentration:  Fair  Recall:  [employer] of Knowledge:Fair  Language: Fair  Akathisia:  No  Handed:  Right  AIMS (if indicated):     Assets:  Desire for Improvement Housing Social Support Talents/Skills Vocational/Educational  ADL's:  Intact  Cognition: WNL  Sleep:  Number of Hours: 2.75   Treatment Plan Summary: Daily contact with patient to assess and evaluate symptoms and progress in treatment and Medication management Supportive approach/coping skills Alcohol dependence; Ativan detox protocol/work a relapse prevention plan Depression: continue the Paxil 20 mg daily Insomnia; increase the Trazodone to 100 mg and continue the Restoril 15 with plans  to decrease the Restoril if the increase in Trazodone was effective Work with CBT/mindfulness/stress management Novali Vollman A, MD 05/18/2015, 6:18 PM

## 2015-05-18 NOTE — Progress Notes (Signed)
D: Patient observed in milieu. Patient goal for the day was to list triggers that make her drink. Patient was able to complete a worksheet and list triggers and coping skills she can utilize not to drink. A: Support and encouragement offered. Q 15 minute checks in progress and maintained.  R: Patient remains safe on unit.

## 2015-05-19 DIAGNOSIS — F10230 Alcohol dependence with withdrawal, uncomplicated: Secondary | ICD-10-CM | POA: Insufficient documentation

## 2015-05-19 MED ORDER — TRAZODONE HCL 100 MG PO TABS
100.0000 mg | ORAL_TABLET | Freq: Every evening | ORAL | Status: DC | PRN
  Administered 2015-05-19: 100 mg via ORAL
  Filled 2015-05-19 (×4): qty 1

## 2015-05-19 MED ORDER — TEMAZEPAM 7.5 MG PO CAPS
7.5000 mg | ORAL_CAPSULE | Freq: Every day | ORAL | Status: DC
  Administered 2015-05-19: 7.5 mg via ORAL
  Filled 2015-05-19: qty 1

## 2015-05-19 NOTE — Progress Notes (Signed)
Recreation Therapy Notes  Date: 03.31.2017 Time: 9:30am Location: 300 Hall Group Room   Group Topic: Stress Management  Goal Area(s) Addresses:  Patient will actively participate in stress management techniques presented during session.   Behavioral Response: Appropriate, Engaged   Intervention: Stress management techniques  Activity :  Deep Breathing and Guided Imagery. LRT provided education, instruction and demonstration on practice of Deep Breathing and Guided Imagery. Patient was asked to participate in technique introduced during session.   Education:  Stress Management, Discharge Planning.   Education Outcome: Acknowledges education/In group clarification offered/Needs additional education  Clinical Observations/Feedback: Patient actively engaged in technique introduced, expressed no concerns and demonstrated ability to practice independently post d/c.   Yianna Tersigni L Hanalei Glace, LRT/CTRS        Evalene Vath L 05/19/2015 2:12 PM 

## 2015-05-19 NOTE — Progress Notes (Signed)
D: Patient observed in milieu watching television. Patient states she is excited about her discharge. States her goal is to "focus on recovery plan."  Support offered to patient. A: Support and encouragement offered. Q 15 minute checks in progress and maintained. R: Patient remains safe on unit.

## 2015-05-19 NOTE — Progress Notes (Signed)
Patient attended wrap-up group and rated her day a 8. Goal was to be clearly informed of recovery after leaving here for intensive rehab.

## 2015-05-19 NOTE — Tx Team (Signed)
Interdisciplinary Treatment Plan Update (Adult) Date: 05/19/2015    Time Reviewed: 9:30 AM  Progress in Treatment: Attending groups: Yes Participating in groups: Yes Taking medication as prescribed: Yes Tolerating medication: Yes Family/Significant other contact made: No, CSW assessing for appropriate contacts Patient understands diagnosis: Yes Discussing patient identified problems/goals with staff: Yes Medical problems stabilized or resolved: Yes Denies suicidal/homicidal ideation: Yes Issues/concerns per patient self-inventory: Yes Other:  New problem(s) identified: N/A  Discharge Plan or Barriers:  Patient plans to return home to follow up with outpatient services at the [facility].  Reason for Continuation of Hospitalization:  Depression Anxiety Medication Stabilization   Comments: N/A  Estimated length of stay: Discharge anticipated for 05/20/15    Patient is a 50 year old female with a diagnosis of Major Depressive Disorder. Pt presented to the hospital with depression, anxiety, and alcohol abuse. Pt reports primary trigger(s) for admission was employment and relationship stressors. Patient will benefit from crisis stabilization, medication evaluation, group therapy and psycho education in addition to case management for discharge planning. At discharge, it is recommended that Pt remain compliant with established discharge plan and continued treatment.   Review of initial/current patient goals per problem list:  1. Goal(s): Patient will participate in aftercare plan   Met: Yes   Target date: 3-5 days post admission date   As evidenced by: Patient will participate within aftercare plan AEB aftercare provider and housing plan at discharge being identified.  3/29: Goal met. Patient plans to return home to follow up with outpatient services at the [facility].    2. Goal (s): Patient will exhibit decreased depressive symptoms and suicidal ideations.   Met:  Yes   Target date: 3-5 days post admission date   As evidenced by: Patient will utilize self rating of depression at 3 or below and demonstrate decreased signs of depression or be deemed stable for discharge by MD.  3/29: Patient rates depression at 8 today.  3/31: Goal met. Patient rates depression 1-2 today.   3. Goal(s): Patient will demonstrate decreased signs and symptoms of anxiety.   Met: Yes   Target date: 3-5 days post admission date   As evidenced by: Patient will utilize self rating of anxiety at 3 or below and demonstrated decreased signs of anxiety, or be deemed stable for discharge by MD  3/29: Patient reports high levels of anxiety today.  3/31: Goal met. Patient reports low levels of anxiety today.    4. Goal(s): Patient will demonstrate decreased signs of withdrawal due to substance abuse   Met: Yes   Target date: 3-5 days post admission date   As evidenced by: Patient will produce a CIWA/COWS score of 0, have stable vitals signs, and no symptoms of withdrawal  3/29: Goal met. No withdrawal symptoms reported at this time per medical chart.   Attendees: Patient:   05/19/2015 9:30 AM   Family:   05/19/2015 9:30 AM   Physician:  Dr. Carlton Adam, MD 05/19/2015 9:30 AM   Nursing:  Soledad Gerlach Desma Paganini, RN 05/19/2015 9:30 AM   Clinical Social Worker: Maxie Better, LCSW 05/19/2015 9:30 AM   Clinical Social Worker: Erasmo Downer Sukhdeep Wieting LCSW; Peri Maris LCSWA 05/19/2015 9:30 AM   Other:  Gerline Legacy Nurse Case Manager 05/19/2015 9:30 AM   Other:  Agustina Caroli, Samuel Jester, NP 05/19/2015 9:30 AM   Other:   05/19/2015 9:30 AM   Other: Norberto Sorenson, P4CC 05/19/2015 9:30 AM   Other:  05/19/2015 9:30  AM   Other:  05/19/2015 9:30 AM      Scribe for Treatment Team:  Tilden Fossa, [place]

## 2015-05-19 NOTE — Progress Notes (Signed)
D: Patient reports decreased withdrawal symptoms. Pt reports appetite and sleep has improved. Pt trying to discharge to the [facility] evening program so she can continue to work during the day. Denies  SI/HI/AVH and pain.  A: Support and encouragement offered as needed. Medications administered as prescribed.  R: Patient cooperative and appropriate on unit. Will continue to monitor patient for safety and stability.

## 2015-05-19 NOTE — Progress Notes (Signed)
Patient ID: Martha Richardson, female   DOB: 11-19-1965, 50 y.o.   MRN: [id number]  Pt currently presents with a pleasant affect and cooperative behavior. Per self inventory, pt rates depression at a 1-2, hopelessness 0 and anxiety 1. Pt's daily goal is to "focus on aftercare and maintaining my sobriety" and they intend to do so by "pray, meditate; inform family members." Pt reports good sleep, a good appetite, normal energy and good concentration.   Pt provided with medications per providers orders. Pt's labs and vitals were monitored throughout the day. Pt supported emotionally and encouraged to express concerns and questions. Pt educated on medications.  Pt's safety ensured with 15 minute and environmental checks. Pt currently denies SI/HI and A/V hallucinations. Pt verbally agrees to seek staff if SI/HI or A/VH occurs and to consult with staff before acting on these thoughts. Will continue POC.

## 2015-05-19 NOTE — [facility] Group Notes (Addendum)
   [facility] Note  05/19/2015  8:45 AM   Participation Quality: Alert, Appropriate and Oriented  Mood/Affect: Appropriate  Depression Rating: 1-2  Anxiety Rating: Reports low anxiety levels  Thoughts of Suicide: Pt denies SI/HI  Will you contract for safety? Yes  Current AVH: Pt denies  Plan for Discharge/Comments: Pt attended discharge planning group and actively participated in group. CSW provided pt with today's workbook. Patient plans to return home to follow up with outpatient services at the [facility].   Transportation Means: Pt reports access to transportation  Supports: No supports mentioned at this time  Samuella BruinKristin Rache Klimaszewski, MSW, Johnson [employer] JohnsonLCSW Clinical Social Worker [facility] [phone]

## 2015-05-19 NOTE — BHH Suicide Risk Assessment (Signed)
[facility]   Principal Problem: Severe episode of recurrent major depressive disorder, without psychotic features [facility](HCC) Discharge Diagnoses:  Patient Active Problem List   Diagnosis Date Noted  . Alcohol dependence with uncomplicated withdrawal (HCC) [F10.230]   . Alcohol abuse with alcohol-induced mental disorder (HCC) [F10.988] 05/17/2015  . Alcohol dependence (HCC) [F10.20] 05/17/2015  . GAD (generalized anxiety disorder) [F41.1]   . Severe episode of recurrent major depressive disorder, without psychotic features (HCC) [F33.2]   . Benzodiazepine dependence, continuous (HCC) [F13.20]   . Tobacco abuse [Z72.0] 06/08/2013  . DIARRHEA [R19.7] 04/26/2010  . BACK PAIN, LUMBAR [M54.5] 11/09/2009  . DE QUERVAIN'S TENOSYNOVITIS [M65.4] 02/20/2009  . LUNG NODULE [J98.4] 07/10/2008  . COUGH, CHRONIC [R05] 07/08/2008  . ALCOHOL ABUSE [F10.10] 06/13/2008  . Rash and other nonspecific skin eruption [R21] 04/08/2008  . HIP PAIN, RIGHT [M25.559] 12/08/2007    Total Time spent with patient: 20 minutes  Musculoskeletal: Strength & Muscle Tone: within normal limits Gait & Station: normal Patient leans: normal  Psychiatric Specialty Exam: Review of Systems  Constitutional: Negative.   HENT: Negative.   Eyes: Negative.   Respiratory: Negative.   Cardiovascular: Negative.   Gastrointestinal: Negative.   Genitourinary: Negative.   Musculoskeletal: Negative.   Skin: Negative.   Neurological: Negative.   Endo/Heme/Allergies: Negative.   Psychiatric/Behavioral: Positive for substance abuse. The patient is nervous/anxious.     Blood pressure 121/53, pulse 74, temperature 97.5 F (36.4 C), temperature source Oral, resp. rate 20, height 5\' 3"  (1.6 m), weight 69.4 kg (153 lb), SpO2 99 %.Body mass index is 27.11 kg/(m^2).  General Appearance: Fairly Groomed  Patent attorneyye Contact::  Fair  Speech:  Clear and Coherent409  Volume:  Normal  Mood:  Anxious  Affect:  Appropriate   Thought Process:  Coherent and Goal Directed  Orientation:  Full (Time, Place, and Person)  Thought Content:  plans as she moves on, relapse prevention plan  Suicidal Thoughts:  No  Homicidal Thoughts:  No  Memory:  Immediate;   Fair Recent;   Fair Remote;   Fair  Judgement:  Fair  Insight:  Present  Psychomotor Activity:  Normal  Concentration:  Fair  Recall:  [employer]  Fund of Knowledge:Fair  Language: Fair  Akathisia:  No  Handed:  Right  AIMS (if indicated):     Assets:  Desire for Improvement Housing Social Support Talents/Skills Vocational/Educational  Sleep:  Number of Hours: 6.5  Cognition: WNL  ADL's:  Intact  In full contact with reality. There are no active S/S of withdrawal. There are no active SI plans or intent. She is willing and motivated to pursue outpatient treatment Mental Status Per Nursing Assessment::   On Admission:     Demographic Factors:  Caucasian  Loss Factors: none identified  Historical Factors: none identified  Risk Reduction Factors:   Sense of responsibility to family, Religious beliefs about death, Employed, Living with another person, especially a relative and Positive social support  Continued Clinical Symptoms:  Depression:   Comorbid alcohol abuse/dependence Insomnia Alcohol/Substance Abuse/Dependencies  Cognitive Features That Contribute To Risk:  None    Suicide Risk:  Minimal: No identifiable suicidal ideation.  Patients presenting with no risk factors but with morbid ruminations; may be classified as minimal risk based on the severity of the depressive symptoms  Follow-up Information    Follow up with [facility] On 05/23/2015.   Why:  Assessment for Intensive Outpatient Program (IOP) with Dr. Cheyenne Adasinger on Tuesday April 4th at  11am. Call office if you need to reschedule appointment.    Contact information:   [address], [place], [place] [address] [phone]       Plan Of Care/Follow-up recommendations:   Activity:  as tolerated Diet:  regular Follow up as above Sharae Zappulla A, MD 05/19/2015, 4:36 PM

## 2015-05-19 NOTE — Progress Notes (Signed)
  [facility] Discharge [facility] :  Will you be returning to the same living situation after discharge:  Yes,  patient plans to return home At discharge, do you have transportation home?: Yes,  patient's family will transport Do you have the ability to pay for your medications: Yes,  patient will be provided with prescriptions at discharge  Release of information consent forms completed and in the chart;  Patient's signature needed at discharge.  Patient to Follow up at: Follow-up Information    Follow up with [facility] On 05/23/2015.   Why:  Assessment for Intensive Outpatient Program (IOP) with Dr. Cheyenne Adasinger on Tuesday April 4th at 11am. Call office if you need to reschedule appointment.    Contact information:   [address], [place], [place] [address] [phone]       Next level of care provider has access to [facility] Link:no  Safety Planning and Suicide Prevention discussed: Yes,  with patient  Have you used any form of tobacco in the last 30 days? (Cigarettes, Smokeless Tobacco, Cigars, and/or Pipes): Yes  Has patient been referred to the Quitline?: Patient refused referral  Patient has been referred for addiction treatment: Yes  Dino Borntreger, [place] CarboKristin L 05/19/2015, 5:00 PM

## 2015-05-19 NOTE — Progress Notes (Signed)
Patient ID: Martha Richardson, female   DOB: [birth year], 50 y.o.   MRN: [id number] [facility]  05/19/2015 4:27 PM Martha Richardson  MRN:  [id number] Subjective:  Martha Richardson has continued to detox. She states she is feeling better. She continues to identify the things she needs to  address to maintain her abstinence. States home is good her husband is supportive. 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 States her drinking is more about the habit she will develop strategies not to go immediately and reach for the bottle when she gts home. State she needs to develop strategies to address her sleep so she is not dependent on the benzodiazepines Principal Problem: Severe episode of recurrent major depressive disorder, without psychotic features (HCC) Diagnosis:   Patient Active Problem List   Diagnosis Date Noted  . Alcohol abuse with alcohol-induced mental disorder (HCC) [F10.988] 05/17/2015  . Alcohol dependence (HCC) [F10.20] 05/17/2015  . GAD (generalized anxiety disorder) [F41.1]   . Severe episode of recurrent major depressive disorder, without psychotic features (HCC) [F33.2]   . Benzodiazepine dependence, continuous (HCC) [F13.20]   . Tobacco abuse [Z72.0] 06/08/2013  . DIARRHEA [R19.7] 04/26/2010  . BACK PAIN, LUMBAR [M54.5] 11/09/2009  . DE QUERVAIN'S TENOSYNOVITIS [M65.4] 02/20/2009  . LUNG NODULE [J98.4] 07/10/2008  . COUGH, CHRONIC [R05] 07/08/2008  . ALCOHOL ABUSE [F10.10] 06/13/2008  . Rash and other nonspecific skin eruption [R21] 04/08/2008  . HIP PAIN, RIGHT [M25.559] 12/08/2007   Total Time spent with patient: 20 minutes  Past Psychiatric History: see admission H and P  Past Medical History:  Past Medical History  Diagnosis Date  . COUGH, CHRONIC 07/08/2008    Qualifier: Diagnosis of  By: Linford ArnoldMetheney MD, Santina Evansatherine     . LUNG NODULE 07/10/2008    Qualifier: Diagnosis of  By: Linford ArnoldMetheney MD, Santina Evansatherine    . ALCOHOL ABUSE 06/13/2008    Qualifier: Diagnosis of  By: Linford ArnoldMetheney MD, Santina Evansatherine     History reviewed. No pertinent past surgical history. Family History: History reviewed. No pertinent family history. Family Psychiatric  History: see admission H and P Social History:  History  Alcohol Use  . 2.4 - 3.6 oz/week  . 4-6 Glasses of wine per week     History  Drug Use No    Social History   Social History  . Marital Status: Married    Spouse Name: N/A  . Number of Children: N/A  . Years of Education: N/A   Social History Main Topics  . Smoking status: Current Some Day Smoker -- 0.25 packs/day    Types: Cigarettes  . Smokeless tobacco: Never Used  . Alcohol Use: 2.4 - 3.6 oz/week    4-6 Glasses of wine per week  . Drug Use: No  . Sexual Activity: Not Asked   Other Topics Concern  . None   Social History Narrative   Additional Social History:    Pain Medications: See PTA Prescriptions: See PTA Over the Counter: See PTA History of alcohol / drug use?: Yes Withdrawal Symptoms: Agitation, Delirium, Sweats, Nausea / Vomiting, Irritability, Tremors Name of Substance 1: Alcohol 1 - Age of First Use: 12 1 - Amount (size/oz): 4-6 glasses of wine 1 - Frequency: daily 1 - Duration: ongoing 1 - Last Use / Amount: today 2 glasses of  wine                  Sleep: poor  Appetite:  Fair  Current Medications: Current Facility-Administered Medications  Medication Dose Route Frequency Provider Last Rate Last Dose  . acetaminophen (TYLENOL) tablet 650 mg  650 mg Oral Q6H PRN Kerry Hough, PA-C      . alum & mag hydroxide-simeth (MAALOX/MYLANTA) 200-200-20 MG/5ML suspension 30 mL  30 mL Oral Q4H PRN Kerry Hough, PA-C      . hydrOXYzine (ATARAX/VISTARIL) tablet 25 mg  25 mg Oral Q6H PRN Kerry Hough, PA-C      . loperamide (IMODIUM) capsule 2-4 mg  2-4 mg Oral PRN Kerry Hough,  PA-C      . LORazepam (ATIVAN) tablet 1 mg  1 mg Oral Q6H PRN Kerry Hough, PA-C      . LORazepam (ATIVAN) tablet 1 mg  1 mg Oral BID Kerry Hough, PA-C       Followed by  . [START ON 05/21/2015] LORazepam (ATIVAN) tablet 1 mg  1 mg Oral Daily Spencer E Simon, PA-C      . magnesium hydroxide (MILK OF MAGNESIA) suspension 30 mL  30 mL Oral Daily PRN Kerry Hough, PA-C      . multivitamin with minerals tablet 1 tablet  1 tablet Oral Daily Kerry Hough, PA-C   1 tablet at 05/19/15 [phone]  . ondansetron (ZOFRAN-ODT) disintegrating tablet 4 mg  4 mg Oral Q6H PRN Kerry Hough, PA-C      . PARoxetine (PAXIL) tablet 20 mg  20 mg Oral Daily Kerry Hough, PA-C   20 mg at 05/19/15 4259  . progesterone (PROMETRIUM) capsule 100 mg  100 mg Oral Daily Kerry Hough, PA-C   100 mg at 05/19/15 0915  . temazepam (RESTORIL) capsule 7.5 mg  7.5 mg Oral QHS Rachael Fee, MD      . thiamine (B-1) injection 100 mg  100 mg Intramuscular Once Kerry Hough, PA-C   100 mg at 05/17/15 0142  . thiamine (VITAMIN B-1) tablet 100 mg  100 mg Oral Daily Kerry Hough, PA-C   100 mg at 05/19/15 5638  . traZODone (DESYREL) tablet 50 mg  50 mg Oral QHS,MR X 1 Kerry Hough, PA-C   50 mg at 05/18/15 2152    Lab Results:  No results found for this or any previous visit (from the past 48 hour(s)).  Blood Alcohol level:  Lab Results  Component Value Date   [facility] 149* 05/16/2015    Physical Findings: AIMS:  , ,  ,  ,    CIWA:  CIWA-Ar Total: 0 COWS:  COWS Total Score: 1  Musculoskeletal: Strength & Muscle Tone: within normal limits Gait & Station: normal Patient leans: normal  Psychiatric Specialty Exam: Review of Systems  Constitutional: Positive for malaise/fatigue.  HENT: Negative.   Eyes: Negative.   Respiratory: Negative.   Cardiovascular: Negative.   Gastrointestinal: Negative.   Genitourinary: Negative.   Musculoskeletal: Positive for back pain and joint pain.  Skin: Negative.    Neurological: Negative.   Endo/Heme/Allergies: Negative.   Psychiatric/Behavioral: Positive for depression and substance abuse. The patient is nervous/anxious.     Blood pressure 121/53, pulse 74, temperature 97.5 F (36.4 C), temperature source Oral, resp. rate 20, height  (1.6 m), weight 69.4 kg (153 lb), SpO2 99 %.Body mass index is 27.11 kg/(m^2).  General Appearance: Fairly Groomed  Patent attorney::  Fair  Speech:  Clear and Coherent  Volume:  Decreased  Mood:  Anxious  Affect:  Restricted  Thought Process:  Coherent and Goal Directed  Orientation:  Full (Time, Place, and Person)  Thought Content:  symptoms events worries concerns  Suicidal Thoughts:  No  Homicidal Thoughts:  No  Memory:  Immediate;   Fair Recent;   Fair Remote;   Fair  Judgement:  Fair  Insight:  Present  Psychomotor Activity:  Normal  Concentration:  Fair  Recall:  [employer] of Knowledge:Fair  Language: Fair  Akathisia:  No  Handed:  Right  AIMS (if indicated):     Assets:  Desire for Improvement Housing Social Support Talents/Skills Vocational/Educational  ADL's:  Intact  Cognition: WNL  Sleep:  Number of Hours: 6.5   Treatment Plan Summary: Daily contact with patient to assess and evaluate symptoms and progress in treatment and Medication management Supportive approach/coping skills Alcohol dependence; Ativan detox protocol/work a relapse prevention plan Depression: continue the Paxil 20 mg daily Insomnia; increase the Trazodone to 100 mg and continue the Restoril but decrease it to 7.5  Work with CBT/mindfulness/stress management Analie Katzman A, MD 05/19/2015, 4:27 PM

## 2015-05-19 NOTE — [facility] Group Notes (Signed)
[facility] LCSW Group Therapy 05/19/2015 1:15 PM Type of Therapy: Group Therapy Participation Level: Active  Participation Quality: Attentive, Sharing and Supportive  Affect: Appropriate  Cognitive: Alert and Oriented  Insight: Developing/Improving and Engaged  Engagement in Therapy: Developing/Improving and Engaged  Modes of Intervention: Clarification, Confrontation, Discussion, Education, Exploration, Limit-setting, Orientation, Problem-solving, Rapport Building, Dance movement psychotherapisteality Testing, Socialization and Support  Summary of Progress/Problems: The topic for today was feelings about relapse. Pt discussed what relapse prevention is to them and identified triggers that they are on the path to relapse. Pt processed their feeling towards relapse and was able to relate to peers. Pt discussed coping skills that can be used for relapse prevention. Patient discussed the importance of setting boundaries with her adult son, as well as her disappointment with herself and that she causes her family due to her drinking. CSW and other group members provided patient with emotional support and encouragement.   Martha BruinKristin Emaan Richardson, MSW, LCSW Clinical Social Worker [facility] [phone]

## 2015-05-19 NOTE — [facility] Suicide Risk Assessment (Signed)
[facility] INPATIENT:  Family/Significant Other Suicide Prevention Education  Suicide Prevention Education:  Contact Attempts: husband Overton MamMike Archuletta [phone], (name of family member/significant other) has been identified by the patient as the family member/significant other with whom the patient will be residing, and identified as the person(s) who will aid the patient in the event of a mental health crisis.  With written consent from the patient, two attempts were made to provide suicide prevention education, prior to and/or following the patient's discharge.  We were unsuccessful in providing suicide prevention education.  A suicide education pamphlet was given to the patient to share with family/significant other.  Date and time of first attempt: 05/19/15 at 4:38pm Date and time of second attempt: 05/19/15 at 4:40pm  [place], [place] CarboKristin L 05/19/2015, 4:59 PM

## 2015-05-20 DIAGNOSIS — F1320 Sedative, hypnotic or anxiolytic dependence, uncomplicated: Secondary | ICD-10-CM

## 2015-05-20 MED ORDER — HYDROXYZINE HCL 25 MG PO TABS: ORAL_TABLET | ORAL | Status: DC

## 2015-05-20 MED ORDER — TRAZODONE HCL 100 MG PO TABS: ORAL_TABLET | ORAL | Status: DC

## 2015-05-20 MED ORDER — TEMAZEPAM 7.5 MG PO CAPS: 7.5000 mg | ORAL_CAPSULE | Freq: Every day | ORAL | Status: DC

## 2015-05-20 MED ORDER — PROGESTERONE MICRONIZED 100 MG PO CAPS: 100.0000 mg | ORAL_CAPSULE | Freq: Every day | ORAL | Status: DC

## 2015-05-20 MED ORDER — PAROXETINE HCL 20 MG PO TABS: 20.0000 mg | ORAL_TABLET | Freq: Every day | ORAL | Status: DC

## 2015-05-20 NOTE — Discharge Summary (Signed)
Physician Discharge Summary Note  Patient:  Martha Richardson is an 50 y.o., female MRN:  [id number] DOB:  1965-11-05 Patient phone:  [phone] (home)  Patient address:   [address] [place] [address],  Total Time spent with patient: Greater than 30 minutes  Date of Admission:  05/16/2015  Date of Discharge: 05-20-15  Reason for Admission: Alcohol detox treatment  Principal Problem: Severe episode of recurrent major depressive disorder, without psychotic features [facility](HCC)  Discharge Diagnoses: Patient Active Problem List   Diagnosis Date Noted  . Alcohol dependence with uncomplicated withdrawal (HCC) [F10.230]   . Alcohol abuse with alcohol-induced mental disorder (HCC) [F10.988] 05/17/2015  . Alcohol dependence (HCC) [F10.20] 05/17/2015  . GAD (generalized anxiety disorder) [F41.1]   . Severe episode of recurrent major depressive disorder, without psychotic features (HCC) [F33.2]   . Benzodiazepine dependence, continuous (HCC) [F13.20]   . Tobacco abuse [Z72.0] 06/08/2013  . DIARRHEA [R19.7] 04/26/2010  . BACK PAIN, LUMBAR [M54.5] 11/09/2009  . DE QUERVAIN'S TENOSYNOVITIS [M65.4] 02/20/2009  . LUNG NODULE [J98.4] 07/10/2008  . COUGH, CHRONIC [R05] 07/08/2008  . ALCOHOL ABUSE [F10.10] 06/13/2008  . Rash and other nonspecific skin eruption [R21] 04/08/2008  . HIP PAIN, RIGHT [M25.559] 12/08/2007   Past Psychiatric History: Alcoholism, chronic, Major depression  Past Medical History:  Past Medical History  Diagnosis Date  . COUGH, CHRONIC 07/08/2008    Qualifier: Diagnosis of  By: Linford ArnoldMetheney MD, Santina Evansatherine    . LUNG NODULE 07/10/2008    Qualifier: Diagnosis of  By: Linford ArnoldMetheney MD, Santina Evansatherine    . ALCOHOL ABUSE 06/13/2008    Qualifier: Diagnosis of  By: Linford ArnoldMetheney MD, Santina Evansatherine     History reviewed. No pertinent past surgical history. Family History: History reviewed. No pertinent family history.  Family Psychiatric  History: See H&P  Social History:  History  Alcohol Use   . 2.4 - 3.6 oz/week  . 4-6 Glasses of wine per week     History  Drug Use No    Social History   Social History  . Marital Status: Married    Spouse Name: N/A  . Number of Children: N/A  . Years of Education: N/A   Social History Main Topics  . Smoking status: Current Some Day Smoker -- 0.25 packs/day    Types: Cigarettes  . Smokeless tobacco: Never Used  . Alcohol Use: 2.4 - 3.6 oz/week    4-6 Glasses of wine per week  . Drug Use: No  . Sexual Activity: Not Asked   Other Topics Concern  . None   Social History Narrative   Hospital Course: 50 Y/O female who states she lost control of her life. States she suffers from severe depression and alcohol abuse. States she does not abuse her prescriptions drugs but does not want to be dependent on them. Having a hard time making decisions. It has been gradually building up. Admits to stress coming from among other things changes in her job. States she drinks alcohol 4-6 glasses of wine amounting to 1.5 l every day for about 10 years. Can't remember a day where there was no foreign substances in her body. Admits to feeling increasingly more depressed having the feeling she cant do this anymore. Admits to suicidal ideas with plan but no intent. Would not do this to her children.  Martha Richardson was admitted to the hospital for alcohol detoxification treatment. Her blood alcohol level upon admission was 149 per toxicology tests reports & UDS positive for Benzodiazepine. She was intoxicated & requesting  alcohol/Benzodiazepine detox. Martha Richardson's recent lab reports indicated elevated liver enzymes (AST), possibly from chronic alcoholism. As a result, not a candidate for Librium detox protocols. This is because, Librium is a long acting Benzodiazepine with a long half-life. If used for this particular detox treatment will impose heavily on already compromised liver enzymes. Ativan detox protocols were used instead.  By using Martha Richardson received a cleaner  detox treatment without the lingering adverse effects of the Librium capsules in her systems.  Besides the detox treatment, Martha Richardson also was medicated and discharged on; Paroxetine 20 mg for depression, Restoril 7.5 mg for insomnia, Hydroxyzine 25 mg prn for anxiety & Trazodone 100 mg for insomnia. She also received other medication management for her other medical issues that she presented. She tolerated her treatment regimen without any significant adverse effects and or reactions. Martha Richardson participated in the AA/NA meetings and group counseling sessions being offered and held on this unit. She learned coping skills.  Martha Richardson has completed detox treatment & her mood is stable. She is being discharged to her home to continue treatment on an outpatient basis as noted below. She has been given all the necessary information needed to make this appointment without problems. Upon discharge, she denies any SIHI, AVH, delusional thoughts, paranoia and or substance withdrawal symptoms. She left [facility] with all personal belongings in no distress. Transportation per friend.   Consults:  psychiatry  Physical Findings: AIMS:  , ,  ,  ,    CIWA:  CIWA-Ar Total: 0 COWS:  COWS Total Score: 1  Musculoskeletal: Strength & Muscle Tone: within normal limits Gait & Station: normal Patient leans: N/A  Psychiatric Specialty Exam: Review of Systems  Constitutional: Negative.   HENT: Negative.   Eyes: Negative.   Respiratory: Negative.   Cardiovascular: Negative.   Gastrointestinal: Negative.   Genitourinary: Negative.   Musculoskeletal: Negative.   Skin: Negative.   Neurological: Negative.   Endo/Heme/Allergies: Negative.   Psychiatric/Behavioral: Positive for depression (Stable) and substance abuse (Alcohoplism, chronic). Negative for suicidal ideas, hallucinations and memory loss. The patient has insomnia (Stable). The patient is not nervous/anxious.     Blood pressure 105/57, pulse 67, temperature 97.3 F  (36.3 C), temperature source Oral, resp. rate 18, height  (1.6 m), weight 69.4 kg (153 lb), SpO2 99 %.Body mass index is 27.11 kg/(m^2).  See Md's SRA   Have you used any form of tobacco in the last 30 days? (Cigarettes, Smokeless Tobacco, Cigars, and/or Pipes): Yes  Has this patient used any form of tobacco in the last 30 days? (Cigarettes, Smokeless Tobacco, Cigars, and/or Pipes): No  Blood Alcohol level:  Lab Results  Component Value Date   [facility] 149* 05/16/2015    Metabolic Disorder Labs:  Lab Results  Component Value Date   HGBA1C 5.4 05/17/2015   MPG 108 05/17/2015   No results found for: PROLACTIN Lab Results  Component Value Date   CHOL 272* 05/17/2015   TRIG 250* 05/17/2015   HDL 82 05/17/2015   CHOLHDL 3.3 05/17/2015   VLDL 50* 05/17/2015   LDLCALC 140* 05/17/2015   LDLCALC 101* 06/24/2007    See Psychiatric Specialty Exam and Suicide Risk Assessment completed by Attending Physician prior to discharge.  Discharge destination:  Home  Is patient on multiple antipsychotic therapies at discharge:  No   Has Patient had three or more failed trials of antipsychotic monotherapy by history:  No  Recommended Plan for Multiple Antipsychotic Therapies: NA    Medication List  STOP taking these medications        ALPRAZolam 0.5 MG tablet  Commonly known as:  XANAX     ASTROGLIDE Gel     naproxen sodium 220 MG tablet  Commonly known as:  ANAPROX      TAKE these medications      Indication   hydrOXYzine 25 MG tablet  Commonly known as:  ATARAX/VISTARIL  Take 1 tablet (25 mg) four times daily as needed: For anxiety   Indication:  Anxiety     PARoxetine 20 MG tablet  Commonly known as:  PAXIL  Take 1 tablet (20 mg total) by mouth daily. For depression   Indication:  Major Depressive Disorder     progesterone 100 MG capsule  Commonly known as:  PROMETRIUM  Take 1 capsule (100 mg total) by mouth daily. For hormone therapy   Indication:  Hormone Therapy      temazepam 7.5 MG capsule  Commonly known as:  RESTORIL  Take 1 capsule (7.5 mg total) by mouth at bedtime. For sleep   Indication:  Trouble Sleeping     traZODone 100 MG tablet  Commonly known as:  DESYREL  Take 1 tablet (100 mg) at bedtime: For sleep   Indication:  Trouble Sleeping       Follow-up Information    Follow up with [facility] On 05/23/2015.   Why:  Assessment for Intensive Outpatient Program (IOP) with Dr. Cheyenne Adas on Tuesday April 4th at 11am. Call office if you need to reschedule appointment.    Contact information:   [address], [place], [place] [address] [phone]      Follow-up recommendations: Activity:  As tolerated Diet: As recommended by your primary care doctor. Keep all scheduled follow-up appointments as recommended.   Comments: Take all your medications as prescribed by your mental healthcare provider. Report any adverse effects and or reactions from your medicines to your outpatient provider promptly. Patient is instructed and cautioned to not engage in alcohol and or illegal drug use while on prescription medicines. In the event of worsening symptoms, patient is instructed to call the crisis hotline, 911 and or go to the nearest ED for appropriate evaluation and treatment of symptoms. Follow-up with your primary care provider for your other medical issues, concerns and or health care needs.   Signed: Sanjuana Kava, NP, PMHNP, FNP-BC 05/20/2015, 9:14 AM

## 2015-05-20 NOTE — [facility] Group Notes (Signed)
Adult Therapy Group Note - Clinical Social Work  Date:  05/20/2015 Time:  10:00-11:00AM  Group Topic/Focus:  Healthy Coping Skills  Additional Comments:  The main focus of today's process group was to discuss patient-specific behaviors that have prevented them from living the life they want.  The reasons underlying these behaviors were touched on lightly.  How to make a plan to promote learning how to use different behaviors was then explored fully.  Pt reported that procrastination, drinking too much, and working so hard that she has no time for herself are three behaviors that keep her from living the life she wants.  She was very supportive and encouraging of other patients.  Participation Level:  Active  Participation Quality:  Attentive, Sharing and Supportive  Affect:  Flat and Tearful  Cognitive:  Alert and Appropriate  Insight: Good  Engagement in Group:  Engaged  Modes of Intervention:  Clarification and Discussion  Sarina SerGrossman-Orr, Katalia Choma Jo 05/20/2015, 12:47 PM

## 2015-06-06 ENCOUNTER — Encounter: Payer: Self-pay | Admitting: Obstetrics and Gynecology

## 2015-09-11 ENCOUNTER — Telehealth: Payer: Self-pay

## 2015-09-11 ENCOUNTER — Ambulatory Visit (INDEPENDENT_AMBULATORY_CARE_PROVIDER_SITE_OTHER): Payer: Managed Care, Other (non HMO)

## 2015-09-11 ENCOUNTER — Ambulatory Visit (INDEPENDENT_AMBULATORY_CARE_PROVIDER_SITE_OTHER): Payer: Private Health Insurance - Indemnity | Admitting: Family Medicine

## 2015-09-11 DIAGNOSIS — M89311 Hypertrophy of bone, right shoulder: Secondary | ICD-10-CM

## 2015-09-11 DIAGNOSIS — M50122 Cervical disc disorder at C5-C6 level with radiculopathy: Secondary | ICD-10-CM | POA: Diagnosis not present

## 2015-09-11 DIAGNOSIS — M4802 Spinal stenosis, cervical region: Secondary | ICD-10-CM | POA: Diagnosis not present

## 2015-09-11 DIAGNOSIS — M5412 Radiculopathy, cervical region: Secondary | ICD-10-CM | POA: Diagnosis not present

## 2015-09-11 MED ORDER — PREDNISONE 10 MG (48) PO TBPK: ORAL_TABLET | Freq: Every day | ORAL | 0 refills | Status: DC

## 2015-09-11 MED ORDER — GABAPENTIN 300 MG PO CAPS: ORAL_CAPSULE | ORAL | 3 refills | Status: DC

## 2015-09-11 MED ORDER — HYDROCODONE-ACETAMINOPHEN 5-325 MG PO TABS: 1.0000 | ORAL_TABLET | Freq: Four times a day (QID) | ORAL | 0 refills | Status: DC | PRN

## 2015-09-11 NOTE — Progress Notes (Signed)
   Subjective:    I'm seeing this patient as a consultation for:  Dr. Linford Arnold  CC: Right shoulder pain  HPI: Martha Richardson is a 50 y.o. female who [facility] to [facility] today for right shoulder pain.  The pain began 2 weeks ago in her posterior shoulder, near the medial border of her right scapula.  It then progressed to involve her right lateral neck and radiated down her arm into her forearm and hand.  She describes the pain as a throbbing.  She also reports numbness and tingling of her right hand but denies weakness.  Denies known injury.  Patient reports leaning forward exacerbates the pain.  She has tried ice, tylenol, and ibuprofen without relief.  She denies fever, chills, and unintentional weight loss.    On review of her records, she had a previous cervical xray in 2010 that was significant for degenerative disc changes in C5 and C6.   Past medical history, Surgical history, Family history not pertinant except as noted below, Social history, Allergies, and medications have been entered into the medical record, reviewed, and no changes needed.   Review of Systems: No headache, visual changes, nausea, vomiting, diarrhea, constipation, dizziness, abdominal pain, skin rash, fevers, chills, night sweats, weight loss, swollen lymph nodes, chest pain, shortness of breath, mood changes, visual or auditory hallucinations.   Objective:    Vitals:   09/11/15 1524  BP: 109/74  Pulse: 98   General: Well Developed, well nourished, and in no acute distress.  Neuro/Psych: Alert and oriented x3, able to move all 4 extremities, sensation grossly intact. Skin: Warm and dry, no rashes noted.  Respiratory: Not using accessory muscles, speaking in full sentences, trachea midline.  Cardiovascular: Pulses palpable, no extremity edema. Abdomen: Does not appear distended. MSK: Neck: No obvious abnormalities Limited range of motion to 65 degrees  with lateral rotation and lateral flexion to the right.  Full range of motion with flexion, extension, and lateral rotation/flexion to the left.   Nontender over spinal midline as well as the trapezius or midline cervical spine.   Strength, sensation, and pulses intact Pain with lateral rotation and lateral flexion to the right. Right shoulder:  Full range of motion.  No obvious abnormalities.  No bony tenderness.    No results found for this or any previous visit (from the past 24 hour(s)). No results found.   Cervical spine Xray per my reading:  Degenerative disc and arthritic changes at C5-C6-C7.   Foraminal narrowing at rt C6-C7. Awaiting formal radiologist reading.    Impression and Recommendations:    Assessment and Plan: 50 y.o. female with right shoulder pain and radicular symptoms concerning for acute onset cervical radiculopathy.   - Physical therapy - Prednisone dose pack - Gabapentin 300 mg TID - Norco 5-325 mg - Follow up in 1 week, will evaluate further with MRI or steroid injection if not improved.    Caution against mixing opiate pain medications and gabapentin with alcohol.   Discussed warning signs or symptoms. Please see discharge instructions. Patient expresses understanding.    CC: METHENEY,CATHERINE, MD

## 2015-09-11 NOTE — Patient Instructions (Signed)
Thank you for coming in today. Start prednisone Attend physical therapy Start gabapentin just at night advancing to 3 times daily over the next few days or week Use hydrocodone sparingly for pain that is severe Do not mix Hydrocodone and alcohol or other sedating medications Avoid heavy lifting Return in one to 2 weeks Come back or go to the emergency room if you notice new weakness new numbness problems walking or bowel or bladder problems.    Cervical Radiculopathy Cervical radiculopathy happens when a nerve in the neck (cervical nerve) is pinched or bruised. This condition can develop because of an injury or as part of the normal aging process. Pressure on the cervical nerves can cause pain or numbness that runs from the neck all the way down into the arm and fingers. Usually, this condition gets better with rest. Treatment may be needed if the condition does not improve.  CAUSES This condition may be caused by:  Injury.  Slipped (herniated) disk.  Muscle tightness in the neck because of overuse.  Arthritis.  Breakdown or degeneration in the bones and joints of the spine (spondylosis) due to aging.  Bone spurs that may develop near the cervical nerves. SYMPTOMS Symptoms of this condition include:  Pain that runs from the neck to the arm and hand. The pain can be severe or irritating. It may be worse when the neck is moved.  Numbness or weakness in the affected arm and hand. DIAGNOSIS This condition may be diagnosed based on symptoms, medical history, and a physical exam. You may also have tests, including:  X-rays.  CT scan.  MRI.  Electromyogram (EMG).  Nerve conduction tests. TREATMENT In many cases, treatment is not needed for this condition. With rest, the condition usually gets better over time. If treatment is needed, options may include:  Wearing a soft neck collar for short periods of time.  Physical therapy to strengthen your neck muscles.  Medicines,  such as NSAIDs, oral corticosteroids, or spinal injections.  Surgery. This may be needed if other treatments do not help. Various types of surgery may be done depending on the cause of your problems. HOME CARE INSTRUCTIONS Managing Pain  Take over-the-counter and prescription medicines only as told by your health care provider.  If directed, apply ice to the affected area.  Put ice in a plastic bag.  Place a towel between your skin and the bag.  Leave the ice on for 20 minutes, 2-3 times per day.  If ice does not help, you can try using heat. Take a warm shower or warm bath, or use a heat pack as told by your health care provider.  Try a gentle neck and shoulder massage to help relieve symptoms. Activity  Rest as needed. Follow instructions from your health care provider about any restrictions on activities.  Do stretching and strengthening exercises as told by your health care provider or physical therapist. General Instructions  If you were given a soft collar, wear it as told by your health care provider.  Use a flat pillow when you sleep.  Keep all follow-up visits as told by your health care provider. This is important. SEEK MEDICAL CARE IF:  Your condition does not improve with treatment. SEEK IMMEDIATE MEDICAL CARE IF:  Your pain gets much worse and cannot be controlled with medicines.  You have weakness or numbness in your hand, arm, face, or leg.  You have a high fever.  You have a stiff, rigid neck.  You lose control  of your bowels or your bladder (have incontinence).  You have trouble with walking, balance, or speaking.   This information is not intended to replace advice given to you by your health care provider. Make sure you discuss any questions you have with your health care provider.   Document Released: 10/30/2000 Document Revised: 10/26/2014 Document Reviewed: 03/31/2014 Elsevier Interactive Patient Education [employer]

## 2015-09-11 NOTE — Telephone Encounter (Signed)
Pt called and left a VM stating that she was suppose to get a letter for work with restrictions regarding lifting. Please advise. Pt would like a call when it is ready.

## 2015-09-12 ENCOUNTER — Encounter: Payer: Self-pay | Admitting: Family Medicine

## 2015-09-12 ENCOUNTER — Ambulatory Visit: Payer: Self-pay | Admitting: Family Medicine

## 2015-09-12 NOTE — Telephone Encounter (Signed)
Letter written. Waiting for pickup.

## 2015-09-12 NOTE — Telephone Encounter (Signed)
Letter faxed to [phone] per pt request.

## 2015-09-14 ENCOUNTER — Telehealth: Payer: Self-pay

## 2015-09-14 DIAGNOSIS — M5412 Radiculopathy, cervical region: Secondary | ICD-10-CM

## 2015-09-14 NOTE — Telephone Encounter (Signed)
Pt left a vm requesting a referral for outpatient rehab be faxed to [facility]  Phone [phone] Fax [phone]. Pt would prefer to be seen at this location. Please advise.

## 2015-09-15 NOTE — Telephone Encounter (Signed)
Called pt. Mailbox full. Will call back later.

## 2015-09-15 NOTE — Telephone Encounter (Signed)
Will do!

## 2015-09-19 NOTE — Telephone Encounter (Signed)
Called pt to notify her that the referral she request has been placed. Pt stated that she is still experiencing significant pain and that she has run out of hydrocodone and that it provided her with little relief. She would like a rx refill of pain medication and would like to know if there is an alternate or stronger medication. Please advise.

## 2015-09-20 NOTE — Telephone Encounter (Addendum)
Called pt to advise recommendations. No answer. Will call back later.

## 2015-09-20 NOTE — Telephone Encounter (Signed)
Return to clinic if pain is not controlled. She will need MRI likely.

## 2015-09-21 NOTE — Telephone Encounter (Signed)
Appt made for Monday per Pt request due to uncontrolled pain.

## 2015-09-22 ENCOUNTER — Telehealth: Payer: Self-pay

## 2015-09-22 DIAGNOSIS — M5412 Radiculopathy, cervical region: Secondary | ICD-10-CM

## 2015-09-22 MED ORDER — MELOXICAM 15 MG PO TABS: ORAL_TABLET | ORAL | 3 refills | Status: DC

## 2015-09-22 NOTE — Telephone Encounter (Signed)
Martha Richardson called and states she still has pain in her neck. The gabapentin is not helping with the pain. She would like a refill of the Hydrocodone. Dr Denyse Amass saw patient for neck pain.

## 2015-09-22 NOTE — Telephone Encounter (Signed)
Adding meloxicam, also at this point she needs an MRI. I will order this and she can follow-up with Dr. Denyse Amass. Ultimate plan will be epidural steroid injection.

## 2015-09-22 NOTE — Telephone Encounter (Signed)
Patient advised of recommendations.  

## 2015-09-25 ENCOUNTER — Encounter: Payer: Self-pay | Admitting: Family Medicine

## 2015-09-25 ENCOUNTER — Ambulatory Visit (INDEPENDENT_AMBULATORY_CARE_PROVIDER_SITE_OTHER): Payer: Managed Care, Other (non HMO) | Admitting: Family Medicine

## 2015-09-25 VITALS — BP 118/82 | HR 85 | Wt 156.0 lb

## 2015-09-25 DIAGNOSIS — M5412 Radiculopathy, cervical region: Secondary | ICD-10-CM

## 2015-09-25 MED ORDER — HYDROCODONE-ACETAMINOPHEN 5-325 MG PO TABS: 1.0000 | ORAL_TABLET | Freq: Four times a day (QID) | ORAL | 0 refills | Status: DC | PRN

## 2015-09-25 MED ORDER — LORAZEPAM 0.5 MG PO TABS: ORAL_TABLET | ORAL | 0 refills | Status: DC

## 2015-09-25 NOTE — Patient Instructions (Signed)
Thank you for coming in today. Use ativan prior to MRI.  Make sure someone will drive you.  You should hear about the MRI soon.   Come back or go to the emergency room if you notice new weakness new numbness problems walking or bowel or bladder problems.

## 2015-09-25 NOTE — Progress Notes (Signed)
Martha Richardson is a 50 y.o. female who presents to [facility] today for follow up of neck pain with right sided radicular symptoms.  Patient was seen in the office last week with this complaint and was treated with gabapentin 300 TID, prednisone dose pack, norco, and physical therapy.  Since then she has failed to see an improvement in her neck pain or radicular symptoms.  She denies right arm weakness, but continues to complain of significant right arm numbness, tingling and pain that gives her trouble sleeping.  Patient has been to one session of physical therapy and is scheduled for another session later today.    Past Medical History:  Diagnosis Date  . ALCOHOL ABUSE 06/13/2008   Qualifier: Diagnosis of  By: Linford Arnold MD, Santina Evans    . COUGH, CHRONIC 07/08/2008   Qualifier: Diagnosis of  By: Linford Arnold MD, Santina Evans    . LUNG NODULE 07/10/2008   Qualifier: Diagnosis of  By: Linford Arnold MD, Santina Evans     No past surgical history on file. Social History  Substance Use Topics  . Smoking status: Current Some Day Smoker    Packs/day: 0.25    Types: Cigarettes  . Smokeless tobacco: Never Used  . Alcohol use 2.4 - 3.6 oz/week    4 - 6 Glasses of wine per week   family history is not on file.  ROS as above:  Medications: Current Outpatient Prescriptions  Medication Sig Dispense Refill  . gabapentin (NEURONTIN) 300 MG capsule One tab PO qHS for a week, then BID for a week, then TID. May double weekly to a max of 3,600mg /day 180 capsule 3  . HYDROcodone-acetaminophen (NORCO/VICODIN) 5-325 MG tablet Take 1 tablet by mouth every 6 (six) hours as needed. 15 tablet 0  . hydrOXYzine (ATARAX/VISTARIL) 25 MG tablet Take 1 tablet (25 mg) four times daily as needed: For anxiety 60 tablet 0  . meloxicam (MOBIC) 15 MG tablet One tab PO qAM with breakfast for 2 weeks, then daily prn  pain. 30 tablet 3  . PARoxetine (PAXIL) 20 MG tablet Take 1 tablet (20 mg total) by mouth daily. For depression 30 tablet 0  . predniSONE (STERAPRED UNI-PAK 48 TAB) 10 MG (48) TBPK tablet Take by mouth daily. 12 day dosepack po 48 tablet 0  . progesterone (PROMETRIUM) 100 MG capsule Take 1 capsule (100 mg total) by mouth daily. For hormone therapy    . temazepam (RESTORIL) 7.5 MG capsule Take 1 capsule (7.5 mg total) by mouth at bedtime. For sleep 10 capsule 0  . traZODone (DESYREL) 100 MG tablet Take 1 tablet (100 mg) at bedtime: For sleep 30 tablet 0  . LORazepam (ATIVAN) 0.5 MG tablet Take 30-60 mins prior to to MRI. 2 tablet 0   No current facility-administered medications for this visit.    Allergies  Allergen Reactions  . Zoloft [Sertraline Hcl] Other (See Comments)    Insomnia     Exam:  BP 118/82   Pulse 85   Wt 156 lb (70.8 kg)   BMI 27.63 kg/m  Gen: Well NAD HEENT: EOMI,  MMM Lungs: Normal work of breathing. CTABL Heart: RRR no MRG Abd: NABS, Soft. Nondistended, Nontender Exts: Brisk capillary refill, warm and well perfused.   Neck: no obvious deformities Full range of motion of her neck in flexion, extension, rotation, and lateral flexion.  Pain worse with lateral flexion and rotation to the right Strength, sensation, and reflexes intact  in the upper extremities. Positive Spurling test to the right, negative left sided Spurling  No results found for this or any previous visit (from the past 24 hour(s)). No results found.    Assessment and Plan: 50 y.o. female with cervical radiculopathy.  She has failed to see improvement with steroids, physical therapy, and gabapentin.   - Schedule cervical MRI, with plan to proceed with fluoroscopic guided cervical epidural injection - Continue with physical therapy. - Norco refill  - Ativan 0.5 mg tablet for anxiety prior to MRI  Establish problem worsened   Orders Placed This Encounter  Procedures  . MR Cervical  Spine Wo Contrast    Standing Status:   Future    Standing Expiration Date:   11/24/2016    Order Specific Question:   Reason for Exam (SYMPTOM  OR DIAGNOSIS REQUIRED)    Answer:   eval rt cervical radiculopathy    Order Specific Question:   Preferred imaging location?    Answer:   [facility]  (table limit-350lbs)    Order Specific Question:   What is the patient's sedation requirement?    Answer:   No Sedation    Order Specific Question:   Does the patient have a pacemaker or implanted devices?    Answer:   No    Discussed warning signs or symptoms. Please see discharge instructions. Patient expresses understanding.

## 2015-09-27 ENCOUNTER — Ambulatory Visit: Payer: Self-pay | Admitting: Family Medicine

## 2015-10-02 ENCOUNTER — Ambulatory Visit (INDEPENDENT_AMBULATORY_CARE_PROVIDER_SITE_OTHER): Payer: Managed Care, Other (non HMO)

## 2015-10-02 ENCOUNTER — Telehealth: Payer: Self-pay | Admitting: Family Medicine

## 2015-10-02 ENCOUNTER — Other Ambulatory Visit: Payer: Self-pay | Admitting: Family Medicine

## 2015-10-02 ENCOUNTER — Encounter (INDEPENDENT_AMBULATORY_CARE_PROVIDER_SITE_OTHER): Payer: Self-pay

## 2015-10-02 DIAGNOSIS — M5412 Radiculopathy, cervical region: Secondary | ICD-10-CM

## 2015-10-02 DIAGNOSIS — M5031 Other cervical disc degeneration,  high cervical region: Secondary | ICD-10-CM | POA: Diagnosis not present

## 2015-10-02 DIAGNOSIS — M4802 Spinal stenosis, cervical region: Secondary | ICD-10-CM | POA: Diagnosis not present

## 2015-10-02 MED ORDER — HYDROCODONE-ACETAMINOPHEN 5-325 MG PO TABS: 1.0000 | ORAL_TABLET | Freq: Four times a day (QID) | ORAL | 0 refills | Status: DC | PRN

## 2015-10-02 NOTE — Telephone Encounter (Signed)
MRI shows probable pinched nerve causing pain. Epidural steroid injection order.

## 2015-10-02 NOTE — Progress Notes (Signed)
Norco refilled

## 2015-10-05 ENCOUNTER — Telehealth: Payer: Self-pay

## 2015-10-05 NOTE — Telephone Encounter (Signed)
Martha Richardson called and stated she has scheduled the epidural for Monday. She states she will need a refill of the hydrocodone to get her through the weekend. Please advise.

## 2015-10-06 MED ORDER — HYDROCODONE-ACETAMINOPHEN 5-325 MG PO TABS: 1.0000 | ORAL_TABLET | Freq: Four times a day (QID) | ORAL | 0 refills | Status: DC | PRN

## 2015-10-06 NOTE — Telephone Encounter (Signed)
Patient advised to pick up prescription.

## 2015-10-06 NOTE — Telephone Encounter (Signed)
Norco refilled

## 2015-10-09 ENCOUNTER — Ambulatory Visit
Admission: RE | Admit: 2015-10-09 | Discharge: 2015-10-09 | Disposition: A | Discharge status: Home / Self Care | Payer: Managed Care, Other (non HMO) | Source: Ambulatory Visit | Attending: Family Medicine | Admitting: Family Medicine

## 2015-10-09 MED ORDER — IOPAMIDOL (ISOVUE-M 300) INJECTION 61%
1.0000 mL | Freq: Once | INTRAMUSCULAR | Status: AC | PRN
  Administered 2015-10-09: 1 mL via EPIDURAL

## 2015-10-09 MED ORDER — TRIAMCINOLONE ACETONIDE 40 MG/ML IJ SUSP (RADIOLOGY)
60.0000 mg | Freq: Once | INTRAMUSCULAR | Status: AC
  Administered 2015-10-09: 60 mg via EPIDURAL

## 2015-10-09 NOTE — Discharge Instructions (Signed)

## 2015-10-12 ENCOUNTER — Other Ambulatory Visit: Payer: Self-pay

## 2016-06-26 ENCOUNTER — Encounter: Payer: Self-pay | Admitting: Gastroenterology

## 2016-09-03 ENCOUNTER — Encounter: Payer: Self-pay | Admitting: Gastroenterology

## 2018-06-02 ENCOUNTER — Ambulatory Visit: Payer: Self-pay | Admitting: Family Medicine

## 2018-06-03 ENCOUNTER — Telehealth: Payer: Self-pay | Admitting: General Practice

## 2018-06-03 ENCOUNTER — Encounter: Payer: Self-pay | Admitting: Family Medicine

## 2018-06-03 ENCOUNTER — Ambulatory Visit: Payer: BLUE CROSS/BLUE SHIELD | Admitting: Family Medicine

## 2018-06-03 VITALS — BP 104/59 | HR 71 | Temp 98.2°F | Ht 63.0 in | Wt 154.0 lb

## 2018-06-03 DIAGNOSIS — R21 Rash and other nonspecific skin eruption: Secondary | ICD-10-CM | POA: Diagnosis not present

## 2018-06-03 DIAGNOSIS — L678 Other hair color and hair shaft abnormalities: Secondary | ICD-10-CM

## 2018-06-03 DIAGNOSIS — E785 Hyperlipidemia, unspecified: Secondary | ICD-10-CM | POA: Diagnosis not present

## 2018-06-03 DIAGNOSIS — Z800 Family history of malignant neoplasm of digestive organs: Secondary | ICD-10-CM

## 2018-06-03 DIAGNOSIS — R748 Abnormal levels of other serum enzymes: Secondary | ICD-10-CM | POA: Diagnosis not present

## 2018-06-03 DIAGNOSIS — F411 Generalized anxiety disorder: Secondary | ICD-10-CM

## 2018-06-03 DIAGNOSIS — L853 Xerosis cutis: Secondary | ICD-10-CM

## 2018-06-03 DIAGNOSIS — F1020 Alcohol dependence, uncomplicated: Secondary | ICD-10-CM

## 2018-06-03 MED ORDER — TRIAMCINOLONE ACETONIDE 0.1 % EX OINT: 1.0000 "application " | TOPICAL_OINTMENT | Freq: Two times a day (BID) | CUTANEOUS | 0 refills | Status: DC

## 2018-06-03 MED ORDER — ALPRAZOLAM 0.5 MG PO TABS: 0.5000 mg | ORAL_TABLET | Freq: Two times a day (BID) | ORAL | 0 refills | Status: DC | PRN

## 2018-06-03 NOTE — Progress Notes (Addendum)
New Patient Office Visit  Subjective:  Patient ID: Martha Richardson, female    DOB: [birth year]  Age: 53 y.o. MRN: [id number]  CC:  Chief Complaint  Patient presents with  . Rash    started on her chest 2-3 wks ago moveed to her breasts and on her arms she has been using benadryl cream   . Anxiety    pt would like to discuss getting back on medication for this. she said that she was taking xanax 0.5 mg prn    HPI Martha Richardson presents to re-establish care and to discuss a rash.  I last saw her in 2015 though she did see 1 of my partners in 2017 for right cervical radiculopathy.   She does complain of a itchy rash.  Started on her chest 2-3 wks ago moveed to her breasts and on her arms she has been using benadryl cream and some over-the-counter hydrocortisone.  She says it only helps a little.  She denies seeing any blisters.  She denies any fevers chills or sweats.  She denies any changes to any soaps, detergents, lotions etc.  She says she is really try to think through if there is anything new.  She would also like to discuss anxiety.  She still has occasional panic attacks.  She had previously taken Xanax 0.5 mg as needed and wanted to know if she could have a new prescription for that medication. She does have a history of alcohol abuse.   Hyperlipidemia -not currently on medication.  Past Medical History:  Diagnosis Date  . ALCOHOL ABUSE 06/13/2008   Qualifier: Diagnosis of  By: Linford Arnold MD, Santina Evans    . COUGH, CHRONIC 07/08/2008   Qualifier: Diagnosis of  By: Linford Arnold MD, Santina Evans    . LUNG NODULE 07/10/2008   Qualifier: Diagnosis of  By: Linford Arnold MD, Santina Evans      History reviewed. No pertinent surgical history.  History reviewed. No pertinent family history.  Social History   Socioeconomic History  . Marital status: Married    Spouse name: Not on file  . Number of children: Not on file  . Years of education: Not on file  . Highest education level: Not on file   Occupational History  . Not on file  Social Needs  . Financial resource strain: Not on file  . Food insecurity:    Worry: Not on file    Inability: Not on file  . Transportation needs:    Medical: Not on file    Non-medical: Not on file  Tobacco Use  . Smoking status: Current Some Day Smoker    Packs/day: 0.25    Types: Cigarettes  . Smokeless tobacco: Never Used  Substance and Sexual Activity  . Alcohol use: Yes    Alcohol/week: 4.0 - 6.0 standard drinks    Types: 4 - 6 Glasses of wine per week  . Drug use: No  . Sexual activity: Not on file  Lifestyle  . Physical activity:    Days per week: Not on file    Minutes per session: Not on file  . Stress: Not on file  Relationships  . Social connections:    Talks on phone: Not on file    Gets together: Not on file    Attends religious service: Not on file    Active member of club or organization: Not on file    Attends meetings of clubs or organizations: Not on file    Relationship status: Not on  file  . Intimate partner violence:    Fear of current or ex partner: Not on file    Emotionally abused: Not on file    Physically abused: Not on file    Forced sexual activity: Not on file  Other Topics Concern  . Not on file  Social History Narrative  . Not on file    ROS Review of Systems  Objective:   Today's Vitals: BP (!) 104/59   Pulse 71   Temp 98.2 F (36.8 C)   Ht  (1.6 m)   Wt 154 lb (69.9 kg)   SpO2 98%   BMI 27.28 kg/m   Physical Exam Constitutional:      Appearance: She is well-developed.  HENT:     Head: Normocephalic and atraumatic.  Cardiovascular:     Rate and Rhythm: Normal rate and regular rhythm.     Heart sounds: Normal heart sounds.  Pulmonary:     Effort: Pulmonary effort is normal.     Breath sounds: Normal breath sounds.  Skin:    General: Skin is warm and dry.     Findings: Rash present.     Comments: Small erythematous papules scattered over her up chest.  No drainage.  Some look excoriated.   Neurological:     Mental Status: She is alert and oriented to person, place, and time.  Psychiatric:        Behavior: Behavior normal.     Assessment & Plan:   Problem List Items Addressed This Visit      Musculoskeletal and Integument   Rash and other nonspecific skin eruption     Other   Hyperlipidemia    To recheck lipid panel.  Continue work on [employer] and regular exercise.      Relevant Orders   COMPLETE METABOLIC PANEL WITH GFR (Completed)   Lipid panel (Completed)   CBC (Completed)   TSH (Completed)   Hemoglobin A1c (Completed)   GAD (generalized anxiety disorder)    Did discuss options.  Go ahead and refill the alprazolam which she has been on previously it looks like the last refill was the spot fall by another provider.  Just reminded her to make sure that she is using it sparingly and not relying on it daily.  If she is using it daily then she needs to be on a controller and we discussed that thoroughly today.      Relevant Medications   ALPRAZolam (XANAX) 0.5 MG tablet   Family history of colon cancer in father    Place referral for colonoscopy. She is overdue.  + fam hx      Relevant Orders   Ambulatory referral to Gastroenterology    Other Visit Diagnoses    Dry skin dermatitis    -  Primary   Relevant Orders   COMPLETE METABOLIC PANEL WITH GFR (Completed)   Lipid panel (Completed)   CBC (Completed)   TSH (Completed)   Hemoglobin A1c (Completed)   Dry hair       Relevant Orders   COMPLETE METABOLIC PANEL WITH GFR (Completed)   Lipid panel (Completed)   CBC (Completed)   TSH (Completed)   Hemoglobin A1c (Completed)   Elevated liver enzymes       Relevant Orders   US ABDOMEN COMPLETE W/ELASTOGRAPHY     Dry skin dermatitis-rash most consistent with dry skin and may be even eczema and excoriation.  Will treat with a higher strength topical steroid cream.  If not  proving over the next week then please let us know.  Dry  skin/dry hair recommend checking her thyroid.  Her TSH was actually borderline elevated about 3 years ago when she was admitted to behavioral health.   Labs revealed elevated liver enzymes.  Similar but higher than number from 4 years ago. Will check for hepatitis and order US with elastography.   Outpatient Encounter Medications as of 06/03/2018  Medication Sig  . ALPRAZolam (XANAX) 0.5 MG tablet Take 1 tablet (0.5 mg total) by mouth 2 (two) times daily as needed for anxiety.  . [DISCONTINUED] ALPRAZolam (XANAX) 0.5 MG tablet Take 1 tablet (0.5 mg total) by mouth 2 (two) times daily as needed for anxiety.  . [DISCONTINUED] gabapentin (NEURONTIN) 300 MG capsule One tab PO qHS for a week, then BID for a week, then TID. May double weekly to a max of 3,600mg /day  . [DISCONTINUED] HYDROcodone-acetaminophen (NORCO/VICODIN) 5-325 MG tablet Take 1 tablet by mouth every 6 (six) hours as needed.  . [DISCONTINUED] hydrOXYzine (ATARAX/VISTARIL) 25 MG tablet Take 1 tablet (25 mg) four times daily as needed: For anxiety  . [DISCONTINUED] LORazepam (ATIVAN) 0.5 MG tablet Take 30-60 mins prior to to MRI.  . [DISCONTINUED] meloxicam (MOBIC) 15 MG tablet One tab PO qAM with breakfast for 2 weeks, then daily prn pain.  . [DISCONTINUED] PARoxetine (PAXIL) 20 MG tablet Take 1 tablet (20 mg total) by mouth daily. For depression  . [DISCONTINUED] predniSONE (STERAPRED UNI-PAK 48 TAB) 10 MG (48) TBPK tablet Take by mouth daily. 12 day dosepack po  . [DISCONTINUED] progesterone (PROMETRIUM) 100 MG capsule Take 1 capsule (100 mg total) by mouth daily. For hormone therapy  . [DISCONTINUED] temazepam (RESTORIL) 7.5 MG capsule Take 1 capsule (7.5 mg total) by mouth at bedtime. For sleep  . [DISCONTINUED] traZODone (DESYREL) 100 MG tablet Take 1 tablet (100 mg) at bedtime: For sleep   No facility-administered encounter medications on file as of 06/03/2018.     Follow-up: Return in about 1 year (around 06/03/2019).    Nani Gasseratherine Aubreyanna Dorrough, MD

## 2018-06-03 NOTE — Telephone Encounter (Signed)
Pt was seen today Dr Linford Arnold for rash. There was supposed to be a steroid cream sent over, routing to Provider for completion. Pharmacy on file correct.

## 2018-06-03 NOTE — Telephone Encounter (Signed)
Sorry rx sent 

## 2018-06-03 NOTE — Assessment & Plan Note (Signed)
To recheck lipid panel.  Continue work on [employer] and regular exercise.

## 2018-06-03 NOTE — Assessment & Plan Note (Addendum)
Place referral for colonoscopy. She is overdue.  + fam hx

## 2018-06-03 NOTE — Progress Notes (Signed)
Pt goes to [facility] [place] for her paps and mammograms. Records release sent.Laureen Ochs, Viann Shove, CMA

## 2018-06-03 NOTE — Assessment & Plan Note (Signed)
Did discuss options.  Go ahead and refill the alprazolam which she has been on previously it looks like the last refill was the spot fall by another provider.  Just reminded her to make sure that she is using it sparingly and not relying on it daily.  If she is using it daily then she needs to be on a controller and we discussed that thoroughly today.

## 2018-06-03 NOTE — Telephone Encounter (Signed)
Pt advised.

## 2018-06-05 NOTE — Addendum Note (Signed)
Addended by: Nani Gasser D on: 06/05/2018 09:26 AM   Modules accepted: Orders

## 2018-06-08 LAB — HEMOGLOBIN A1C
Hgb A1c MFr Bld: 5.1 % of total Hgb (ref <5.7)
Mean Plasma Glucose: 100 (calc)
eAG (mmol/L): 5.5 (calc)

## 2018-06-08 LAB — CBC
HCT: 40.6 % (ref 35.0–45.0)
Hemoglobin: 13.9 g/dL (ref 11.7–15.5)
MCH: 33.8 pg — ABNORMAL HIGH (ref 27.0–33.0)
MCHC: 34.2 g/dL (ref 32.0–36.0)
MCV: 98.8 fL (ref 80.0–100.0)
MPV: 9.9 fL (ref 7.5–12.5)
Platelets: 223 10*3/uL (ref 140–400)
RBC: 4.11 10*6/uL (ref 3.80–5.10)
RDW: 12.8 % (ref 11.0–15.0)
WBC: 5.4 10*3/uL (ref 3.8–10.8)

## 2018-06-08 LAB — LIPID PANEL
Cholesterol: 201 mg/dL — ABNORMAL HIGH (ref <200)
HDL: 77 mg/dL (ref >=50)
LDL Cholesterol (Calc): 106 mg/dL (calc) — ABNORMAL HIGH
Non-HDL Cholesterol (Calc): 124 mg/dL (calc) (ref <130)
Total CHOL/HDL Ratio: 2.6 (calc) (ref <5.0)
Triglycerides: 89 mg/dL (ref <150)

## 2018-06-08 LAB — COMPLETE METABOLIC PANEL WITH GFR
AG Ratio: 2.1 (calc) (ref 1.0–2.5)
ALT: 116 U/L — ABNORMAL HIGH (ref 6–29)
AST: 106 U/L — ABNORMAL HIGH (ref 10–35)
Albumin: 4.4 g/dL (ref 3.6–5.1)
Alkaline phosphatase (APISO): 55 U/L (ref 37–153)
BUN: 11 mg/dL (ref 7–25)
CO2: 26 mmol/L (ref 20–32)
Calcium: 9.6 mg/dL (ref 8.6–10.4)
Chloride: 103 mmol/L (ref 98–110)
Creat: 0.67 mg/dL (ref 0.50–1.05)
GFR, Est African American: 116 mL/min/{1.73_m2} (ref >=60)
GFR, Est Non African American: 100 mL/min/{1.73_m2} (ref >=60)
Globulin: 2.1 g/dL (calc) (ref 1.9–3.7)
Glucose, Bld: 74 mg/dL (ref 65–99)
Potassium: 4.7 mmol/L (ref 3.5–5.3)
Sodium: 143 mmol/L (ref 135–146)
Total Bilirubin: 0.4 mg/dL (ref 0.2–1.2)
Total Protein: 6.5 g/dL (ref 6.1–8.1)

## 2018-06-08 LAB — HEP PANEL, GENERAL
Hep B Core Total Ab: NONREACTIVE
Hep B S Ab: REACTIVE — AB
Hepatitis A AB,Total: REACTIVE — AB
Hepatitis B Surface Ag: NONREACTIVE
Hepatitis C Ab: NONREACTIVE
SIGNAL TO CUT-OFF: 0.01 (ref <1.00)

## 2018-06-08 LAB — TEST AUTHORIZATION

## 2018-06-08 LAB — TSH: TSH: 2.04 mIU/L

## 2018-06-09 MED ORDER — NALTREXONE HCL 50 MG PO TABS: 50.0000 mg | ORAL_TABLET | Freq: Every day | ORAL | 1 refills | Status: DC

## 2018-06-09 NOTE — Addendum Note (Signed)
Addended by: Nani Gasser D on: 06/09/2018 04:33 PM   Modules accepted: Orders

## 2018-07-02 ENCOUNTER — Encounter: Payer: Self-pay | Admitting: Family Medicine

## 2018-07-02 ENCOUNTER — Ambulatory Visit (INDEPENDENT_AMBULATORY_CARE_PROVIDER_SITE_OTHER): Payer: BLUE CROSS/BLUE SHIELD | Admitting: Family Medicine

## 2018-07-02 VITALS — HR 80 | Wt 153.0 lb

## 2018-07-02 DIAGNOSIS — F1020 Alcohol dependence, uncomplicated: Secondary | ICD-10-CM | POA: Diagnosis not present

## 2018-07-02 DIAGNOSIS — F332 Major depressive disorder, recurrent severe without psychotic features: Secondary | ICD-10-CM | POA: Diagnosis not present

## 2018-07-02 DIAGNOSIS — F10188 Alcohol abuse with other alcohol-induced disorder: Secondary | ICD-10-CM

## 2018-07-02 DIAGNOSIS — F411 Generalized anxiety disorder: Secondary | ICD-10-CM

## 2018-07-02 DIAGNOSIS — F10988 Alcohol use, unspecified with other alcohol-induced disorder: Secondary | ICD-10-CM

## 2018-07-02 MED ORDER — VENLAFAXINE HCL 37.5 MG PO TABS: ORAL_TABLET | ORAL | 1 refills | Status: DC

## 2018-07-02 MED ORDER — NALTREXONE HCL 50 MG PO TABS: 100.0000 mg | ORAL_TABLET | Freq: Every day | ORAL | 1 refills | Status: DC

## 2018-07-02 NOTE — Progress Notes (Signed)
Virtual Visit via Video Note  I connected with Martha Richardson on 07/02/18 at  9:10 AM EDT by a video enabled telemedicine application and verified that I am speaking with the correct person using two identifiers.   I discussed the limitations of evaluation and management by telemedicine and the availability of in person appointments. The patient expressed understanding and agreed to proceed.  Pt was at home and I was in my office for the virtual visit.     Subjective:    CC: Alcohol abuse.    HPI: 53 year old female following up for alcohol abuse disorder.  I recently saw her and started her on naltrexone.  Pt reports that its not helping her. She doesn't know what else to do because this is not curbing her desire for alcohol. She will be going out of town on Monday for 10 days and would like a refill on Xanax.  She is actually supposed to have surgery this summer and really wants to be free of alcohol at that point.  She just really wishes that she was able to quit drinking.  She says quitting smoking was way easier.  She became very tearful during the conversation.  She did [facility] and did well but was very expensive and they did urine testing and she actually came back positive and they asked her to leave the program.  She is also done AA in the past and says that was helpful she knows that there is an AA program about 5 minutes from her home.   Past medical history, Surgical history, Family history not pertinant except as noted below, Social history, Allergies, and medications have been entered into the medical record, reviewed, and corrections made.   Review of Systems: No fevers, chills, night sweats, weight loss, chest pain, or shortness of breath.   Objective:    General: Speaking clearly in complete sentences without any shortness of breath.  Alert and oriented x3.  Normal judgment. No apparent acute distress.  Well-groomed.    Impression and  Recommendations:    Alcohol abuse -Discussed options including getting back into a quit program, possibly changing and or adjusting medications, and really working on focusing on treating her depression and anxiety which can sometimes perpetuate and trigger the desire for alcohol..  Can increase dose on the naltrexone 100 mg, or can switch to disulfiram or acamprosate. Discussed AA meeting and a quit program.  She wants to try increasing the naltrexone.  She is very fearful of any medications causing GI upset as she Artie has problems with chronic diarrhea.  If this is not helpful then we could always consider Topamax in the future as well there is some evidence that can be helpful.  MDD/Anxiety -we also discussed that we really need to come down off of the benzodiazepines and that it binds to similar receptors that trigger the alcohol abuse and that they should not be used for chronic use in patients with alcohol abuse.  Again I think getting into an AA program and maybe even doing some individual therapy/counseling for the depression and anxiety could be very helpful for her.  She is okay with starting a new medication, as well as limiting her benzodiazepine use.. F/U in 2-3 weeks.     I discussed the assessment and treatment plan with the patient. The patient was provided an opportunity to ask questions and all were answered. The patient agreed with the plan and demonstrated an understanding  of the instructions.   The patient was advised to call back or seek an in-person evaluation if the symptoms worsen or if the condition fails to improve as anticipated.   Beatrice Lecher, MD

## 2018-07-02 NOTE — Progress Notes (Signed)
Pt reports that its not helping her. She doesn't know what else to do because this is not curbing her desire for alcohol. She will be going out of town on Monday for 10 days and would like a refill on Xanax. Laureen Ochs, Viann Shove, CMA

## 2018-07-29 ENCOUNTER — Ambulatory Visit
Admission: RE | Admit: 2018-07-29 | Discharge: 2018-07-29 | Disposition: A | Discharge status: Home / Self Care | Payer: BC Managed Care – PPO | Source: Ambulatory Visit | Attending: Family Medicine | Admitting: Family Medicine

## 2018-07-29 DIAGNOSIS — R748 Abnormal levels of other serum enzymes: Secondary | ICD-10-CM

## 2018-07-29 DIAGNOSIS — K7689 Other specified diseases of liver: Secondary | ICD-10-CM | POA: Diagnosis not present

## 2018-07-31 DIAGNOSIS — R946 Abnormal results of thyroid function studies: Secondary | ICD-10-CM | POA: Diagnosis not present

## 2018-07-31 DIAGNOSIS — N951 Menopausal and female climacteric states: Secondary | ICD-10-CM | POA: Diagnosis not present

## 2018-07-31 DIAGNOSIS — R635 Abnormal weight gain: Secondary | ICD-10-CM | POA: Diagnosis not present

## 2018-07-31 DIAGNOSIS — R7989 Other specified abnormal findings of blood chemistry: Secondary | ICD-10-CM | POA: Diagnosis not present

## 2018-07-31 DIAGNOSIS — E782 Mixed hyperlipidemia: Secondary | ICD-10-CM | POA: Diagnosis not present

## 2018-08-04 DIAGNOSIS — N951 Menopausal and female climacteric states: Secondary | ICD-10-CM | POA: Diagnosis not present

## 2018-08-04 DIAGNOSIS — Z1331 Encounter for screening for depression: Secondary | ICD-10-CM | POA: Diagnosis not present

## 2018-08-04 DIAGNOSIS — R946 Abnormal results of thyroid function studies: Secondary | ICD-10-CM | POA: Diagnosis not present

## 2018-08-04 DIAGNOSIS — Z6827 Body mass index (BMI) 27.0-27.9, adult: Secondary | ICD-10-CM | POA: Diagnosis not present

## 2018-08-04 DIAGNOSIS — R5383 Other fatigue: Secondary | ICD-10-CM | POA: Diagnosis not present

## 2018-08-04 DIAGNOSIS — Z1339 Encounter for screening examination for other mental health and behavioral disorders: Secondary | ICD-10-CM | POA: Diagnosis not present

## 2018-08-13 DIAGNOSIS — E782 Mixed hyperlipidemia: Secondary | ICD-10-CM | POA: Diagnosis not present

## 2018-08-13 DIAGNOSIS — Z6827 Body mass index (BMI) 27.0-27.9, adult: Secondary | ICD-10-CM | POA: Diagnosis not present

## 2018-08-20 ENCOUNTER — Encounter: Payer: Self-pay | Admitting: Family Medicine

## 2018-09-01 DIAGNOSIS — E559 Vitamin D deficiency, unspecified: Secondary | ICD-10-CM | POA: Diagnosis not present

## 2018-09-01 DIAGNOSIS — Z6827 Body mass index (BMI) 27.0-27.9, adult: Secondary | ICD-10-CM | POA: Diagnosis not present

## 2018-09-01 DIAGNOSIS — Z72820 Sleep deprivation: Secondary | ICD-10-CM | POA: Diagnosis not present

## 2018-09-01 DIAGNOSIS — R946 Abnormal results of thyroid function studies: Secondary | ICD-10-CM | POA: Diagnosis not present

## 2018-09-01 DIAGNOSIS — E782 Mixed hyperlipidemia: Secondary | ICD-10-CM | POA: Diagnosis not present

## 2018-09-01 DIAGNOSIS — N951 Menopausal and female climacteric states: Secondary | ICD-10-CM | POA: Diagnosis not present

## 2018-09-01 DIAGNOSIS — R5383 Other fatigue: Secondary | ICD-10-CM | POA: Diagnosis not present

## 2018-09-02 DIAGNOSIS — Z1231 Encounter for screening mammogram for malignant neoplasm of breast: Secondary | ICD-10-CM | POA: Diagnosis not present

## 2018-09-02 DIAGNOSIS — Z01419 Encounter for gynecological examination (general) (routine) without abnormal findings: Secondary | ICD-10-CM | POA: Diagnosis not present

## 2018-09-02 DIAGNOSIS — Z6828 Body mass index (BMI) 28.0-28.9, adult: Secondary | ICD-10-CM | POA: Diagnosis not present

## 2018-09-02 LAB — HM PAP SMEAR: HM Pap smear: NEGATIVE

## 2018-09-10 DIAGNOSIS — N951 Menopausal and female climacteric states: Secondary | ICD-10-CM | POA: Diagnosis not present

## 2018-09-10 DIAGNOSIS — Z72820 Sleep deprivation: Secondary | ICD-10-CM | POA: Diagnosis not present

## 2018-09-10 DIAGNOSIS — R5383 Other fatigue: Secondary | ICD-10-CM | POA: Diagnosis not present

## 2018-09-10 DIAGNOSIS — R946 Abnormal results of thyroid function studies: Secondary | ICD-10-CM | POA: Diagnosis not present

## 2018-09-28 ENCOUNTER — Telehealth: Payer: Self-pay

## 2018-09-28 NOTE — Telephone Encounter (Signed)
Dr. Penni Homans or [facility] or Dr. Etter Sjogren at the [facility] point location is really good.

## 2018-09-28 NOTE — Telephone Encounter (Signed)
Martha Richardson called to get recommendations for her daughter. Her daughter lives in [place] and would like a female PCP in [place] or [place]. Please advise.

## 2018-09-29 NOTE — Telephone Encounter (Signed)
Patient advised.

## 2018-10-06 ENCOUNTER — Emergency Department (HOSPITAL_COMMUNITY): Payer: BC Managed Care – PPO

## 2018-10-06 ENCOUNTER — Emergency Department (HOSPITAL_COMMUNITY)
Admission: EM | Admit: 2018-10-06 | Discharge: 2018-10-06 | Disposition: A | Discharge status: Home / Self Care | Payer: BC Managed Care – PPO | Attending: Emergency Medicine | Admitting: Emergency Medicine

## 2018-10-06 ENCOUNTER — Other Ambulatory Visit: Payer: Self-pay

## 2018-10-06 ENCOUNTER — Encounter (HOSPITAL_COMMUNITY): Payer: Self-pay

## 2018-10-06 DIAGNOSIS — H538 Other visual disturbances: Secondary | ICD-10-CM | POA: Diagnosis not present

## 2018-10-06 DIAGNOSIS — F17210 Nicotine dependence, cigarettes, uncomplicated: Secondary | ICD-10-CM | POA: Insufficient documentation

## 2018-10-06 DIAGNOSIS — R404 Transient alteration of awareness: Secondary | ICD-10-CM

## 2018-10-06 DIAGNOSIS — F1010 Alcohol abuse, uncomplicated: Secondary | ICD-10-CM | POA: Insufficient documentation

## 2018-10-06 DIAGNOSIS — Z20828 Contact with and (suspected) exposure to other viral communicable diseases: Secondary | ICD-10-CM | POA: Diagnosis not present

## 2018-10-06 DIAGNOSIS — R61 Generalized hyperhidrosis: Secondary | ICD-10-CM | POA: Diagnosis not present

## 2018-10-06 DIAGNOSIS — R410 Disorientation, unspecified: Secondary | ICD-10-CM

## 2018-10-06 DIAGNOSIS — G9341 Metabolic encephalopathy: Secondary | ICD-10-CM | POA: Insufficient documentation

## 2018-10-06 DIAGNOSIS — I499 Cardiac arrhythmia, unspecified: Secondary | ICD-10-CM | POA: Diagnosis not present

## 2018-10-06 DIAGNOSIS — I639 Cerebral infarction, unspecified: Secondary | ICD-10-CM | POA: Diagnosis not present

## 2018-10-06 DIAGNOSIS — R29810 Facial weakness: Secondary | ICD-10-CM | POA: Diagnosis not present

## 2018-10-06 DIAGNOSIS — R4781 Slurred speech: Secondary | ICD-10-CM | POA: Diagnosis not present

## 2018-10-06 DIAGNOSIS — I491 Atrial premature depolarization: Secondary | ICD-10-CM | POA: Diagnosis not present

## 2018-10-06 DIAGNOSIS — R4182 Altered mental status, unspecified: Secondary | ICD-10-CM | POA: Diagnosis not present

## 2018-10-06 LAB — COMPREHENSIVE METABOLIC PANEL
ALT: 70 U/L — ABNORMAL HIGH (ref 0–44)
AST: 58 U/L — ABNORMAL HIGH (ref 15–41)
Albumin: 4.1 g/dL (ref 3.5–5.0)
Alkaline Phosphatase: 58 U/L (ref 38–126)
Anion gap: 12 (ref 5–15)
BUN: 13 mg/dL (ref 6–20)
CO2: 21 mmol/L — ABNORMAL LOW (ref 22–32)
Calcium: 9 mg/dL (ref 8.9–10.3)
Chloride: 110 mmol/L (ref 98–111)
Creatinine, Ser: 0.74 mg/dL (ref 0.44–1.00)
GFR calc Af Amer: >60 mL/min (ref >60)
GFR calc non Af Amer: >60 mL/min (ref >60)
Glucose, Bld: 104 mg/dL — ABNORMAL HIGH (ref 70–99)
Potassium: 3.7 mmol/L (ref 3.5–5.1)
Sodium: 143 mmol/L (ref 135–145)
Total Bilirubin: 0.5 mg/dL (ref 0.3–1.2)
Total Protein: 6.9 g/dL (ref 6.5–8.1)

## 2018-10-06 LAB — RAPID URINE DRUG SCREEN, HOSP PERFORMED
Amphetamines: NOT DETECTED
Barbiturates: NOT DETECTED
Benzodiazepines: NOT DETECTED
Cocaine: NOT DETECTED
Opiates: NOT DETECTED
Tetrahydrocannabinol: NOT DETECTED

## 2018-10-06 LAB — CBG MONITORING, ED: Glucose-Capillary: 98 mg/dL (ref 70–99)

## 2018-10-06 LAB — I-STAT CHEM 8, ED
BUN: 15 mg/dL (ref 6–20)
Calcium, Ion: 1.14 mmol/L — ABNORMAL LOW (ref 1.15–1.40)
Chloride: 110 mmol/L (ref 98–111)
Creatinine, Ser: 0.8 mg/dL (ref 0.44–1.00)
Glucose, Bld: 99 mg/dL (ref 70–99)
HCT: 42 % (ref 36.0–46.0)
Hemoglobin: 14.3 g/dL (ref 12.0–15.0)
Potassium: 3.6 mmol/L (ref 3.5–5.1)
Sodium: 143 mmol/L (ref 135–145)
TCO2: 22 mmol/L (ref 22–32)

## 2018-10-06 LAB — CBC
HCT: 41.1 % (ref 36.0–46.0)
Hemoglobin: 13.9 g/dL (ref 12.0–15.0)
MCH: 35.2 pg — ABNORMAL HIGH (ref 26.0–34.0)
MCHC: 33.8 g/dL (ref 30.0–36.0)
MCV: 104.1 fL — ABNORMAL HIGH (ref 80.0–100.0)
Platelets: 250 10*3/uL (ref 150–400)
RBC: 3.95 MIL/uL (ref 3.87–5.11)
RDW: 11.9 % (ref 11.5–15.5)
WBC: 6.7 10*3/uL (ref 4.0–10.5)
nRBC: 0 % (ref 0.0–0.2)

## 2018-10-06 LAB — PROTIME-INR
INR: 1 (ref 0.8–1.2)
Prothrombin Time: 12.5 seconds (ref 11.4–15.2)

## 2018-10-06 LAB — DIFFERENTIAL
Abs Immature Granulocytes: 0.03 10*3/uL (ref 0.00–0.07)
Basophils Absolute: 0 10*3/uL (ref 0.0–0.1)
Basophils Relative: 1 %
Eosinophils Absolute: 0.2 10*3/uL (ref 0.0–0.5)
Eosinophils Relative: 2 %
Immature Granulocytes: 1 %
Lymphocytes Relative: 16 %
Lymphs Abs: 1 10*3/uL (ref 0.7–4.0)
Monocytes Absolute: 0.6 10*3/uL (ref 0.1–1.0)
Monocytes Relative: 8 %
Neutro Abs: 4.8 10*3/uL (ref 1.7–7.7)
Neutrophils Relative %: 72 %

## 2018-10-06 LAB — APTT: aPTT: 27 seconds (ref 24–36)

## 2018-10-06 LAB — ETHANOL: Alcohol, Ethyl (B): 29 mg/dL — ABNORMAL HIGH (ref <10)

## 2018-10-06 LAB — TSH: TSH: 1.306 u[IU]/mL (ref 0.350–4.500)

## 2018-10-06 LAB — SARS CORONAVIRUS 2 BY RT PCR (HOSPITAL ORDER, PERFORMED IN ~~LOC~~ HOSPITAL LAB): SARS Coronavirus 2: NEGATIVE

## 2018-10-06 LAB — AMMONIA: Ammonia: 47 umol/L — ABNORMAL HIGH (ref 9–35)

## 2018-10-06 MED ORDER — IOHEXOL 350 MG/ML SOLN
100.0000 mL | Freq: Once | INTRAVENOUS | Status: AC | PRN
  Administered 2018-10-06: 100 mL via INTRAVENOUS

## 2018-10-06 MED ORDER — SODIUM CHLORIDE 0.9% FLUSH
3.0000 mL | Freq: Once | INTRAVENOUS | Status: DC
Start: 2018-10-06 — End: 2018-10-06

## 2018-10-06 MED ORDER — LACTATED RINGERS IV SOLN
INTRAVENOUS | Status: DC
  Administered 2018-10-06: 17:00:00 via INTRAVENOUS

## 2018-10-06 NOTE — ED Notes (Signed)
Pt returned from MRI °

## 2018-10-06 NOTE — Consult Note (Addendum)
Neurology Consultation  Reason for Consult: Code stroke Referring Physician: Dr. Jacqulyn BathLong  CC: Confusion  History is obtained from: Husband  HPI: Martha Richardson is a 53 y.o. female with history of lung nodule, cough, alcohol abuse.  She has no history of depression however she is on Xanax and Effexor.  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  She went into a room and was noticed that she was not acting right.  Per husband she was shuffling papers and started to sweat.  He came to the room and told her that she should go home.  He asked her to text her when she got home however there was no text.  For that reason has been drove home to find her.  When he did arrive home she was sweating profusely and was unable to open the garage door due to confusion.  She seemed confused when he got her into the house and eventually brought her to urgent care.  When she got to urgent care patient was advised to go immediately to the emergency room.  Patient has not been under any stressors per husband.  He does not know of any depression or anxiety issues.  He is unclear what she takes for medications.  Currently she is unable to give any history.   ED course CT, CTA of head and neck.   Chart review (no prior neurological notes.)   LKW: 11 AM this morning tpa given?: no, symptoms of confusion however no localizing or lateralizing symptoms Premorbid modified Rankin scale (mRS): 0 NIH stroke scale of 5  ROS: Unable to obtain due to altered mental status.   Past Medical History:  Diagnosis Date  . ALCOHOL ABUSE 06/13/2008   Qualifier: Diagnosis of  By: Linford ArnoldMetheney MD, Santina Evansatherine    . COUGH, CHRONIC 07/08/2008   Qualifier: Diagnosis of  By: Linford ArnoldMetheney MD, Santina Evansatherine    . LUNG NODULE 07/10/2008   Qualifier: Diagnosis of  By: Linford ArnoldMetheney MD, Aurora Maskatherine       Family History  Problem Relation Age of Onset  . Hypertension Mother   . Hypertension Father       Social History:   reports that she has been smoking cigarettes. She has been smoking about 0.25 packs per day. She has never used smokeless tobacco. She reports current alcohol use of about 4.0 - 6.0 standard drinks of alcohol per week. She reports that she does not use drugs.  Medications  Current Facility-Administered Medications:  .  sodium chloride flush (NS) 0.9 % injection 3 mL, 3 mL, Intravenous, Once, Long, Arlyss RepressJoshua G, MD  Current Outpatient Medications:  .  ALPRAZolam (XANAX) 0.5 MG tablet, Take 1 tablet (0.5 mg total) by mouth 2 (two) times daily as needed for anxiety., Disp: 60 tablet, Rfl: 0 .  naltrexone (DEPADE) 50 MG tablet, Take 2 tablets (100 mg total) by mouth daily., Disp: 180 tablet, Rfl: 1 .  venlafaxine (EFFEXOR) 37.5 MG tablet, 1 tab po QD x 10 days, the increase to 2 tabs daily, Disp: 60 tablet, Rfl: 1   Exam: Current vital signs: Ht 5\' 3"  (1.6 m)   Wt 73.1 kg   SpO2 96%   BMI 28.55 kg/m  Vital signs in last 24 hours: SpO2:  [96 %] 96 % (08/18 1515) Weight:  [73.1 kg] 73.1 kg (08/18 1519)  Physical Exam  Constitutional: Appears well-developed and well-nourished.  Psych: Affect appropriate to situation  Eyes: No scleral injection HENT: No OP obstrucion Head: Normocephalic.  Cardiovascular: Normal rate and regular rhythm.  Respiratory: Effort normal, non-labored breathing GI: Soft.  No distension. There is no tenderness.  Skin: WDI  Neuro: Mental Status: Patient at this time is drowsy, she does know that she is in the hospital, she is able to follow very simple commands however this is intermittent.  Patient perseverates on previous questions multiple times.  Patient is unable to give any history Cranial Nerves: II: Visual Fields are full.  III,IV, VI: EOMI without ptosis or diploplia. Pupils equal, round and reactive to light V: Facial sensation is symmetric to temperature VII: Very very minimal left nasolabial fold decrease VIII: hearing is  intact to voice X: Palat elevates symmetrically XI: Shoulder shrug is symmetric. XII: tongue is midline without atrophy or fasciculations.  Motor: Tone is normal. Bulk is normal. 5/5 strength was present in all four extremities.  Sensory: Sensation is symmetric to light touch and temperature in the arms and legs.  Patient is inconsistent with this exam.  At times she names the correct side that I am touching at other times she perseverates on the side that I last touched. Deep Tendon Reflexes: 2+ and symmetric in the biceps and patellae.  Plantars: Toes are downgoing bilaterally.  Cerebellar: FNF and HKS are intact bilaterally    Labs I have reviewed labs in epic and the results pertinent to this consultation are:   CBC    Component Value Date/Time   WBC 6.7 10/06/2018 1449   RBC 3.95 10/06/2018 1449   HGB 14.3 10/06/2018 1456   HCT 42.0 10/06/2018 1456   PLT 250 10/06/2018 1449   MCV 104.1 (H) 10/06/2018 1449   MCH 35.2 (H) 10/06/2018 1449   MCHC 33.8 10/06/2018 1449   RDW 11.9 10/06/2018 1449   LYMPHSABS 1.0 10/06/2018 1449   MONOABS 0.6 10/06/2018 1449   EOSABS 0.2 10/06/2018 1449   BASOSABS 0.0 10/06/2018 1449    CMP     Component Value Date/Time   NA 143 10/06/2018 1456   K 3.6 10/06/2018 1456   CL 110 10/06/2018 1456   CO2 26 06/03/2018 1030   GLUCOSE 99 10/06/2018 1456   BUN 15 10/06/2018 1456   CREATININE 0.80 10/06/2018 1456   CREATININE 0.67 06/03/2018 1030   CALCIUM 9.6 06/03/2018 1030   PROT 6.5 06/03/2018 1030   ALBUMIN 4.8 05/16/2015 1546   AST 106 (H) 06/03/2018 1030   ALT 116 (H) 06/03/2018 1030   ALKPHOS 80 05/16/2015 1546   BILITOT 0.4 06/03/2018 1030   GFRNONAA 100 06/03/2018 1030   GFRAA 116 06/03/2018 1030    Lipid Panel     Component Value Date/Time   CHOL 201 (H) 06/03/2018 1030   TRIG 89 06/03/2018 1030   HDL 77 06/03/2018 1030   CHOLHDL 2.6 06/03/2018 1030   VLDL 50 (H) 05/17/2015 0610   LDLCALC 106 (H) 06/03/2018 1030      Imaging I have reviewed the images obtained:  CT-scan of the brain- no acute intracranial hemorrhage or demarcated territorial infarction.  CTA head neck- no significant intracranial stenosis negative for emergent large vessel occlusion.  No significant carotid or vertebral artery stenosis in the neck.  MRI examination of the brain-pending  Etta Quill PA-C [facility] [phone]  M-F  (9:00 am- 5:00 PM)  10/06/2018, 3:26 PM   ASSESSMENT AND PLAN  53 year old female with past medical history of alcohol abuse presents to the ED with acute encephalopathy.  Patient is also on Xanax and Effexor.  Patient was stroke alerted by EMS for acute confusion and possible left side weakness (reduced left arm grip however this was limited exam per EMS due to her cooperation).  On arrival patient had no focal motor deficits but appeared to be perseverating and confused.  She also appeared to be upset and was crying.  Stat CT head and CT angiogram were negative.  EtOH levels 29.   Impression Acute toxic metabolic encephalopathy  MRI brain to rule out acute stroke/encephalitis. Urine drug screen Reassess exam and patient improves can follow-up as outpatient

## 2018-10-06 NOTE — Progress Notes (Signed)
On re-examination, patient has markedly improved. She had an ETOH of 29, 5 hours after she was noticed to be "off." The symptoms would be unusual for vascular etiology and the duration would be very unusual for seizure. I suspect that alcohol may have played a role, though complicated migraine could also be playing a role.   I do think that an EEG would be prudent, but do not think that she necessarily needs to be admitted given return to normal. I would favor outpatient neurology referral. She should return for further episodes or continued concerns in the interim.   Roland Rack, MD [facility] [phone]  If 7pm- 7am, please page neurology on call as listed in [place].

## 2018-10-06 NOTE — ED Notes (Signed)
Pt to MRI at this time.

## 2018-10-06 NOTE — Code Documentation (Signed)
53yo female arriving to [facility] via [place] at 71. Patient from an urgent care where she presented with acute onset confusion. Patient went to work at 1100 and was noted to become altered. Her husband who also works with her was notified and told her to go home. She drove home, but when her husband sent her a text to ensure she arrived home she did not respond. When he arrived home he found her outside her car in the garage. She laid down on the couch but had persistent confusion so her husband took her to an urgent care. There EMS was called who activated a code stroke for confusion, slurred speech and left sided weakness. Stroke team to the bedside on patient arrival. Labs drawn and patient cleared for CT by Dr. Johnney Killian. Patient to CT with team. CT completed followed by CTA. CTA negative for LVO. NIHSS 5, see documentation for details and code stroke times. Patient with lethargy, unable to state the month, BUE drift, and mild left nasolabial flattening on exam. No acute stroke treatment at this time. Code stroke canceled. Bedside handoff with ED RN Magda Paganini.

## 2018-10-06 NOTE — Discharge Instructions (Signed)
You were seen in the emergency department today with an episode of confusion.  Your MRI did not show any stroke.  Please do not drive a car until you are cleared to do so by a neurologist in case this was a small seizure.  I have placed a referral to the neurologist locally but you will need to call tomorrow to schedule an appointment.  Return to the emergency department with any new or suddenly worsening symptoms.

## 2018-10-06 NOTE — ED Provider Notes (Signed)
Emergency Department Provider Note   I have reviewed the triage vital signs and the nursing notes.   HISTORY  Chief Complaint Code Stroke   HPI Martha Richardson is a 53 y.o. female with PMH of EtOH use/abuse presents to the emergency department as a code stroke.  Last seen normal at 11 AM.  Patient was at her job and according to the coworker at bedside she went in for a consultation with the family.  The consultation ended around 12:30 PM.  The family came out to make staff aware that the patient seemed confused.  She was very repetitive and seemed disoriented.  Staff found the patient to be confused and called EMS.  EMS activated the code stroke in route.  Blood sugar 96.   Level 5 caveat applies with the patient's altered mental status.   Past Medical History:  Diagnosis Date   ALCOHOL ABUSE 06/13/2008   Qualifier: Diagnosis of  By: Madilyn Fireman MD, Tonna Boehringer, CHRONIC 07/08/2008   Qualifier: Diagnosis of  By: Madilyn Fireman MD, Barnetta Chapel     LUNG NODULE 07/10/2008   Qualifier: Diagnosis of  By: Madilyn Fireman MD, [place]      Patient Active Problem List   Diagnosis Date Noted   Hyperlipidemia 06/03/2018   Family history of colon cancer in father 06/03/2018   Right cervical radiculopathy 09/11/2015   Alcohol dependence with uncomplicated withdrawal ([place])    Alcohol abuse with alcohol-induced mental disorder ([place]) 05/17/2015   Alcohol dependence ([place]) 05/17/2015   GAD (generalized anxiety disorder)    Severe episode of recurrent major depressive disorder, without psychotic features ([place])    Benzodiazepine dependence, continuous ([place])    Tobacco abuse 06/08/2013   BACK PAIN, LUMBAR 11/09/2009   DE QUERVAIN'S TENOSYNOVITIS 02/20/2009   LUNG NODULE 07/10/2008   COUGH, CHRONIC 07/08/2008   ALCOHOL ABUSE 06/13/2008   HIP PAIN, RIGHT 12/08/2007    History reviewed. No pertinent surgical history.  Allergies Zoloft [sertraline hcl]  Family History  Problem  Relation Age of Onset   Hypertension Mother    Hypertension Father     Social History Social History   Tobacco Use   Smoking status: Current Some Day Smoker    Packs/day: 0.25    Types: Cigarettes   Smokeless tobacco: Never Used  Substance Use Topics   Alcohol use: Yes    Alcohol/week: 4.0 - 6.0 standard drinks    Types: 4 - 6 Glasses of wine per week   Drug use: No    Review of Systems  Level 5 caveat: AMS  ____________________________________________   PHYSICAL EXAM:  VITAL SIGNS: Vitals:   10/06/18 1645 10/06/18 1700  BP: 117/66 139/72  Pulse: 60 64  Resp: 13 16  SpO2: 98% 100%     Constitutional: Somnolent with some confusion. Can tell me she is in the hospital and in [place]. Can tell me the year is 2020. Tells me the month is "[place]" Eyes: Conjunctivae are normal. PERRL (9mm) Head: Atraumatic. Nose: No congestion/rhinnorhea. Mouth/Throat: Mucous membranes are moist.   Neck: No stridor.   Cardiovascular: Normal rate, regular rhythm. Good peripheral circulation. Grossly normal heart sounds.   Respiratory: Normal respiratory effort.  No retractions. Lungs CTAB. Gastrointestinal: Soft and nontender. No distention.  Musculoskeletal: No lower extremity tenderness nor edema. No gross deformities of extremities. Neurologic: Somnolent. Exam limited with patient not consistently following the exam. Equal grip strength and LE strength. Unable to fully assess sensory function in the extremities. No facial droop.  Skin:  Skin is warm, dry and intact. No rash noted.  ____________________________________________   LABS (all labs ordered are listed, but only abnormal results are displayed)  Labs Reviewed  CBC - Abnormal; Notable for the following components:      Result Value   MCV 104.1 (*)    MCH 35.2 (*)    All other components within normal limits  COMPREHENSIVE METABOLIC PANEL - Abnormal; Notable for the following components:   CO2 21 (*)     Glucose, Bld 104 (*)    AST 58 (*)    ALT 70 (*)    All other components within normal limits  AMMONIA - Abnormal; Notable for the following components:   Ammonia 47 (*)    All other components within normal limits  ETHANOL - Abnormal; Notable for the following components:   Alcohol, Ethyl (B) 29 (*)    All other components within normal limits  I-STAT CHEM 8, ED - Abnormal; Notable for the following components:   Calcium, Ion 1.14 (*)    All other components within normal limits  SARS CORONAVIRUS 2 (HOSPITAL ORDER, PERFORMED IN [facility])  PROTIME-INR  APTT  DIFFERENTIAL  RAPID URINE DRUG SCREEN, HOSP PERFORMED  TSH  CBG MONITORING, ED   ____________________________________________  EKG   EKG Interpretation  Date/Time:  Tuesday October 06 2018 15:19:07 EDT Ventricular Rate:  69 PR Interval:    QRS Duration: 81 QT Interval:  419 QTC Calculation: 449 R Axis:   14 Text Interpretation:  Sinus rhythm Ventricular premature complex Probable anteroseptal infarct, old No STEMI  Confirmed by Alona BeneLong, [place] [phone]([id number]) on 10/06/2018 4:06:56 PM       ____________________________________________  RADIOLOGY  Ct Angio Head W Or Wo Contrast  Result Date: 10/06/2018 CLINICAL DATA:  Focal neuro deficit less than 6 hours. Suspect stroke. Slurred speech and left-sided weakness. Confusion EXAM: CT ANGIOGRAPHY HEAD AND NECK TECHNIQUE: Multidetector CT imaging of the head and neck was performed using the standard protocol during bolus administration of intravenous contrast. Multiplanar CT image reconstructions and MIPs were obtained to evaluate the vascular anatomy. Carotid stenosis measurements (when applicable) are obtained utilizing NASCET criteria, using the distal internal carotid diameter as the denominator. CONTRAST:  100mL OMNIPAQUE IOHEXOL 350 MG/ML SOLN COMPARISON:  CT head 10/06/2018 FINDINGS: CTA NECK FINDINGS Aortic arch: Standard branching. Imaged portion shows no evidence  of aneurysm or dissection. No significant stenosis of the major arch vessel origins. Right carotid system: Normal right carotid system without significant stenosis Left carotid system: Calcified plaque distal left common carotid artery. No significant carotid stenosis on the left Vertebral arteries: Both vertebral arteries widely patent to the basilar without stenosis. Skeleton: Disc degeneration and spurring C5-6 and C6-7. No acute skeletal abnormality. Other neck: Negative Upper chest: Calcified granulomata right upper lobe. Calcified mediastinal and hilar lymph nodes compatible with prior granulomatous infection. Review of the MIP images confirms the above findings CTA HEAD FINDINGS Anterior circulation: Cavernous carotid widely patent bilaterally without significant atherosclerotic disease or calcification. Anterior and middle cerebral arteries widely patent bilaterally without stenosis or large vessel occlusion. Posterior circulation: Both vertebral arteries widely patent to the basilar. Left PICA patent. Right PICA not visualized but likely supplied from right AICA which is patent. Basilar widely patent. Superior cerebellar and posterior cerebral arteries patent bilaterally. Posterior cerebral artery patent bilaterally. Venous sinuses: Patent Anatomic variants: None Review of the MIP images confirms the above findings IMPRESSION: 1. No significant intracranial stenosis and negative for emergent large  vessel occlusion 2. No significant carotid or vertebral artery stenosis in the neck. Electronically Signed   By: Marlan Palauharles  Clark M.D.   On: 10/06/2018 15:29   Ct Angio Neck W Or Wo Contrast  Result Date: 10/06/2018 CLINICAL DATA:  Focal neuro deficit less than 6 hours. Suspect stroke. Slurred speech and left-sided weakness. Confusion EXAM: CT ANGIOGRAPHY HEAD AND NECK TECHNIQUE: Multidetector CT imaging of the head and neck was performed using the standard protocol during bolus administration of intravenous  contrast. Multiplanar CT image reconstructions and MIPs were obtained to evaluate the vascular anatomy. Carotid stenosis measurements (when applicable) are obtained utilizing NASCET criteria, using the distal internal carotid diameter as the denominator. CONTRAST:  100mL OMNIPAQUE IOHEXOL 350 MG/ML SOLN COMPARISON:  CT head 10/06/2018 FINDINGS: CTA NECK FINDINGS Aortic arch: Standard branching. Imaged portion shows no evidence of aneurysm or dissection. No significant stenosis of the major arch vessel origins. Right carotid system: Normal right carotid system without significant stenosis Left carotid system: Calcified plaque distal left common carotid artery. No significant carotid stenosis on the left Vertebral arteries: Both vertebral arteries widely patent to the basilar without stenosis. Skeleton: Disc degeneration and spurring C5-6 and C6-7. No acute skeletal abnormality. Other neck: Negative Upper chest: Calcified granulomata right upper lobe. Calcified mediastinal and hilar lymph nodes compatible with prior granulomatous infection. Review of the MIP images confirms the above findings CTA HEAD FINDINGS Anterior circulation: Cavernous carotid widely patent bilaterally without significant atherosclerotic disease or calcification. Anterior and middle cerebral arteries widely patent bilaterally without stenosis or large vessel occlusion. Posterior circulation: Both vertebral arteries widely patent to the basilar. Left PICA patent. Right PICA not visualized but likely supplied from right AICA which is patent. Basilar widely patent. Superior cerebellar and posterior cerebral arteries patent bilaterally. Posterior cerebral artery patent bilaterally. Venous sinuses: Patent Anatomic variants: None Review of the MIP images confirms the above findings IMPRESSION: 1. No significant intracranial stenosis and negative for emergent large vessel occlusion 2. No significant carotid or vertebral artery stenosis in the neck.  Electronically Signed   By: Marlan Palauharles  Clark M.D.   On: 10/06/2018 15:29   Mr Brain Wo Contrast  Result Date: 10/06/2018 CLINICAL DATA:  Initial evaluation for acute altered mental status, confusion. EXAM: MRI HEAD WITHOUT CONTRAST TECHNIQUE: Multiplanar, multiecho pulse sequences of the brain and surrounding structures were obtained without intravenous contrast. COMPARISON:  Prior CT and CTA from earlier the same day. FINDINGS: Brain: Examination technically limited due to extensive motion artifact. Cerebral volume within normal limits for age. Few scattered subcentimeter T2/FLAIR hyperintensities noted within the deep and subcortical white matter both cerebral hemispheres, nonspecific, but felt to be within normal limits for age. No definite abnormal foci of restricted diffusion to suggest acute or subacute ischemia, although diffusion-weighted sequences are markedly degraded by motion. Gray-white matter grossly maintained without definite signal abnormality. No encephalomalacia to suggest chronic cortical infarction. No foci of susceptibility artifact to suggest acute or chronic intracranial hemorrhage. No mass lesion, midline shift or mass effect. Ventricles normal size without hydrocephalus. No extra-axial fluid collection. Pituitary gland suprasellar region within normal limits. Midline structures intact and normal. Vascular: Major intracranial vascular flow voids grossly maintained at the skull base, better evaluated on prior CTA. Skull and upper cervical spine: Craniocervical junction within normal limits. Visualized upper cervical spine unremarkable. Bone marrow signal intensity within normal limits. No scalp soft tissue abnormality. Sinuses/Orbits: Globes and orbital soft tissues grossly within normal limits. Mild scattered mucosal thickening noted throughout the ethmoidal air  cells, sphenoid sinuses, and maxillary sinuses. No air-fluid level to suggest acute sinusitis. No mastoid effusion. Other: None.  IMPRESSION: 1. Technically limited exam due to extensive motion artifact. 2. Grossly negative brain MRI, with no definite acute intracranial abnormality identified. Electronically Signed   By: Rise Mu M.D.   On: 10/06/2018 19:43   Ct Head Code Stroke Wo Contrast  Result Date: 10/06/2018 CLINICAL DATA:  Code stroke. Focal neuro deficit, greater than 6 hours, stroke suspected. Additional history: Last known normal 11 a.m., slurred speech, left-sided weakness, confusion. EXAM: CT HEAD WITHOUT CONTRAST TECHNIQUE: Contiguous axial images were obtained from the base of the skull through the vertex without intravenous contrast. COMPARISON:  No pertinent prior studies available for comparison. FINDINGS: Brain: There is no acute intracranial hemorrhage or demarcated territorial infarction. No evidence of intracranial mass. No midline shift or extra-axial collection. Cerebral volume is age appropriate. Vascular: No definite hyperdense vessel. Skull: No calvarial fracture. Sinuses/Orbits: Imaged globes and orbits demonstrate no acute abnormality. Moderate bilateral ethmoid sinus mucosal thickening. Mild mucosal thickening within the bilateral maxillary and sphenoid sinuses. No significant mastoid effusion. Other: Age-indeterminate fractures of the bilateral nasal bones. ASPECTS [facility] Stroke Program Early CT Score) - Ganglionic level infarction (caudate, lentiform nuclei, internal capsule, insula, M1-M3 cortex): 7 - Supraganglionic infarction (M4-M6 cortex): 3 Total score (0-10 with 10 being normal): 10 These results were communicated to Dr. Laurence Slate at 3:17 pmon 8/18/2020by text page via the [facility] messaging system. IMPRESSION: No acute intracranial hemorrhage or demarcated territorial infarction. ASPECTS 10. Correlate with findings on concurrent CTA head/neck. Additionally, consider brain MRI for further evaluation. Age intermittent bilateral nasal bone fractures. Paranasal sinus disease as described.  Electronically Signed   By: Jackey Loge   On: 10/06/2018 15:22    ____________________________________________   PROCEDURES  Procedure(s) performed:   Procedures  CRITICAL CARE Performed by: Maia Plan Total critical care time: 35 minutes Critical care time was exclusive of separately billable procedures and treating other patients. Critical care was necessary to treat or prevent imminent or life-threatening deterioration. Critical care was time spent personally by me on the following activities: development of treatment plan with patient and/or surrogate as well as nursing, discussions with consultants, evaluation of patient's response to treatment, examination of patient, obtaining history from patient or surrogate, ordering and performing treatments and interventions, ordering and review of laboratory studies, ordering and review of radiographic studies, pulse oximetry and re-evaluation of patient's condition.  Alona Bene, MD Emergency Medicine  ____________________________________________   INITIAL IMPRESSION / ASSESSMENT AND PLAN / ED COURSE  Pertinent labs & imaging results that were available during my care of the patient were reviewed by me and considered in my medical decision making (see chart for details).   Patient presents to the emergency department for evaluation of acute onset mental status change and concern for possible stroke.  Patient has no focal neuro findings.  She seems more encephalopathic on my exam.  I confirmed this exam and impression with the neurologist who also evaluated the patient, Dr. Lyman Bishop.  CT with no acute findings.  Patient does require MRI.  I have added on EtOH and TSH. Will start IVF. Afebrile.   Labs reviewed. Patient has returned to her mental status baseline. MRI negative for CVA. Discussed outpatient EEG and Neuro referral made from the ED. Patient understands that she is not to drive until cleared to do so by a Neurologist.    ____________________________________________  FINAL CLINICAL IMPRESSION(S) / ED DIAGNOSES  Final  diagnoses:  Transient alteration of awareness     MEDICATIONS GIVEN DURING THIS VISIT:  Medications  iohexol (OMNIPAQUE) 350 MG/ML injection 100 mL (100 mLs Intravenous Contrast Given 10/06/18 1505)    Note:  This document was prepared using Dragon voice recognition software and may include unintentional dictation errors.  Alona BeneJoshua Junior Kenedy, MD Emergency Medicine    [place] Nellums, Arlyss RepressJoshua G, MD 10/07/18 1000

## 2018-10-06 NOTE — ED Triage Notes (Signed)
Per [facility] pt was at work when staff noted pt to be confused and disoriented. Pt LKW 1100am by spouse, CBG 96, NIH 5 20g LAc 20g RAC

## 2018-10-19 ENCOUNTER — Other Ambulatory Visit: Payer: Self-pay

## 2018-10-19 ENCOUNTER — Ambulatory Visit (INDEPENDENT_AMBULATORY_CARE_PROVIDER_SITE_OTHER): Payer: BC Managed Care – PPO | Admitting: Neurology

## 2018-10-19 DIAGNOSIS — R410 Disorientation, unspecified: Secondary | ICD-10-CM | POA: Diagnosis not present

## 2018-10-19 DIAGNOSIS — R404 Transient alteration of awareness: Secondary | ICD-10-CM

## 2018-10-19 NOTE — Procedures (Signed)
    History:  Martha Richardson is a 53 year old patient with a history of alcohol abuse who was seen and evaluated through the emergency room on 06 October 2018 for a transient episode of confusion that occurred while at work.  She is being evaluated for this event.  This is a routine EEG.  No skull defects are noted.  Medications include Xanax, naltrexone, and Effexor.  EEG classification: Normal awake  Description of the recording: The background rhythms of this recording consists of a fairly well modulated medium amplitude alpha rhythm of 9 Hz that is reactive to eye opening and closure. As the record progresses, the patient appears to remain in the waking state throughout the recording. Photic stimulation was performed, resulting in a bilateral and symmetric photic driving response. Hyperventilation was also performed, resulting in a minimal buildup of the background rhythm activities without significant slowing seen. At no time during the recording does there appear to be evidence of spike or spike wave discharges or evidence of focal slowing. EKG monitor shows no evidence of cardiac rhythm abnormalities with a heart rate of 60.  Impression: This is a normal EEG recording in the waking state. No evidence of ictal or interictal discharges are seen.

## 2018-11-17 DIAGNOSIS — N951 Menopausal and female climacteric states: Secondary | ICD-10-CM | POA: Diagnosis not present

## 2018-11-17 DIAGNOSIS — R946 Abnormal results of thyroid function studies: Secondary | ICD-10-CM | POA: Diagnosis not present

## 2018-11-17 DIAGNOSIS — R5383 Other fatigue: Secondary | ICD-10-CM | POA: Diagnosis not present

## 2018-11-17 DIAGNOSIS — Z72820 Sleep deprivation: Secondary | ICD-10-CM | POA: Diagnosis not present

## 2018-11-19 DIAGNOSIS — B009 Herpesviral infection, unspecified: Secondary | ICD-10-CM | POA: Diagnosis not present

## 2018-11-19 DIAGNOSIS — N951 Menopausal and female climacteric states: Secondary | ICD-10-CM | POA: Diagnosis not present

## 2018-11-19 DIAGNOSIS — R5383 Other fatigue: Secondary | ICD-10-CM | POA: Diagnosis not present

## 2018-11-19 DIAGNOSIS — Z72820 Sleep deprivation: Secondary | ICD-10-CM | POA: Diagnosis not present

## 2019-02-23 DIAGNOSIS — Z72820 Sleep deprivation: Secondary | ICD-10-CM | POA: Diagnosis not present

## 2019-02-23 DIAGNOSIS — R5383 Other fatigue: Secondary | ICD-10-CM | POA: Diagnosis not present

## 2019-02-23 DIAGNOSIS — N951 Menopausal and female climacteric states: Secondary | ICD-10-CM | POA: Diagnosis not present

## 2019-02-25 DIAGNOSIS — N951 Menopausal and female climacteric states: Secondary | ICD-10-CM | POA: Diagnosis not present

## 2019-02-25 DIAGNOSIS — R6882 Decreased libido: Secondary | ICD-10-CM | POA: Diagnosis not present

## 2019-02-25 DIAGNOSIS — M2550 Pain in unspecified joint: Secondary | ICD-10-CM | POA: Diagnosis not present

## 2019-04-21 DIAGNOSIS — J342 Deviated nasal septum: Secondary | ICD-10-CM | POA: Diagnosis not present

## 2019-04-21 DIAGNOSIS — J3489 Other specified disorders of nose and nasal sinuses: Secondary | ICD-10-CM | POA: Diagnosis not present

## 2019-04-21 DIAGNOSIS — F17210 Nicotine dependence, cigarettes, uncomplicated: Secondary | ICD-10-CM | POA: Diagnosis not present

## 2019-04-21 DIAGNOSIS — M950 Acquired deformity of nose: Secondary | ICD-10-CM | POA: Diagnosis not present

## 2019-05-05 DIAGNOSIS — H40013 Open angle with borderline findings, low risk, bilateral: Secondary | ICD-10-CM | POA: Diagnosis not present

## 2019-05-13 DIAGNOSIS — N951 Menopausal and female climacteric states: Secondary | ICD-10-CM | POA: Diagnosis not present

## 2019-05-13 DIAGNOSIS — Z72820 Sleep deprivation: Secondary | ICD-10-CM | POA: Diagnosis not present

## 2019-05-13 DIAGNOSIS — F419 Anxiety disorder, unspecified: Secondary | ICD-10-CM | POA: Diagnosis not present

## 2019-06-15 DIAGNOSIS — J3489 Other specified disorders of nose and nasal sinuses: Secondary | ICD-10-CM | POA: Diagnosis not present

## 2019-06-15 DIAGNOSIS — M950 Acquired deformity of nose: Secondary | ICD-10-CM | POA: Diagnosis not present

## 2019-06-15 DIAGNOSIS — J342 Deviated nasal septum: Secondary | ICD-10-CM | POA: Diagnosis not present

## 2019-07-28 DIAGNOSIS — Z13820 Encounter for screening for osteoporosis: Secondary | ICD-10-CM | POA: Diagnosis not present

## 2019-08-04 ENCOUNTER — Ambulatory Visit: Payer: BC Managed Care – PPO | Admitting: Family Medicine

## 2019-08-10 ENCOUNTER — Ambulatory Visit: Payer: BC Managed Care – PPO | Admitting: Sports Medicine

## 2019-08-10 ENCOUNTER — Ambulatory Visit (INDEPENDENT_AMBULATORY_CARE_PROVIDER_SITE_OTHER): Payer: BC Managed Care – PPO

## 2019-08-10 ENCOUNTER — Encounter: Payer: Self-pay | Admitting: Sports Medicine

## 2019-08-10 ENCOUNTER — Other Ambulatory Visit: Payer: Self-pay

## 2019-08-10 DIAGNOSIS — M1811 Unilateral primary osteoarthritis of first carpometacarpal joint, right hand: Secondary | ICD-10-CM | POA: Diagnosis not present

## 2019-08-10 DIAGNOSIS — M19041 Primary osteoarthritis, right hand: Secondary | ICD-10-CM | POA: Diagnosis not present

## 2019-08-10 DIAGNOSIS — M1711 Unilateral primary osteoarthritis, right knee: Secondary | ICD-10-CM | POA: Insufficient documentation

## 2019-08-10 DIAGNOSIS — M25562 Pain in left knee: Secondary | ICD-10-CM | POA: Diagnosis not present

## 2019-08-10 DIAGNOSIS — M79604 Pain in right leg: Secondary | ICD-10-CM

## 2019-08-10 DIAGNOSIS — M79661 Pain in right lower leg: Secondary | ICD-10-CM | POA: Diagnosis not present

## 2019-08-10 DIAGNOSIS — M25561 Pain in right knee: Secondary | ICD-10-CM | POA: Diagnosis not present

## 2019-08-10 MED ORDER — MELOXICAM 15 MG PO TABS: ORAL_TABLET | ORAL | 3 refills | Status: DC

## 2019-08-10 NOTE — Progress Notes (Signed)
° ° °  Procedures performed today:    Procedure: Real-time Ultrasound Guided injection of the right thumb basal joint Device: Samsung HS60  Verbal informed consent obtained.  Time-out conducted.  Noted no overlying erythema, induration, or other signs of local infection.  Skin prepped in a sterile fashion.  Local anesthesia: Topical Ethyl chloride.  With sterile technique and under real time ultrasound guidance:  1/2 cc lidocaine, 1/2 cc Kenalog 40 injected easily  completed without difficulty  Pain immediately resolved suggesting accurate placement of the medication.  Advised to call if fevers/chills, erythema, induration, drainage, or persistent bleeding.  Images permanently stored and available for review in the ultrasound unit.  Impression: Technically successful ultrasound guided injection.  Independent interpretation of notes and tests performed by another provider:   None.  Brief History, Exam, Impression, and Recommendations:    Primary osteoarthritis of first carpometacarpal joint of one hand, right This is a very pleasant 54 year old female, she has severe pain in her right thumb, localized to the basal joint. She has a negative Finkelstein sign. Ibuprofen has not been effective so we injected her thumb basal joint today, adding x-rays, meloxicam, home rehab exercises, return in a month for this.  Right leg pain Denny Peon is also having pain that she localizes over her mid anterolateral tibial shaft, I think this is probably coming from her knee, no paresthesias, nothing running past the ankle. Adding x-rays of her knee and tib-fib on the right. Meloxicam should help this, we can revisit this in a month.    ___________________________________________ Ihor Austin. Benjamin Stain, M.D., ABFM., CAQSM. Primary Care and Sports Medicine [facility]  Adjunct Instructor of Family Medicine  [employer]

## 2019-08-10 NOTE — Assessment & Plan Note (Signed)
This is a very pleasant 54 year old female, she has severe pain in her right thumb, localized to the basal joint. She has a negative Finkelstein sign. Ibuprofen has not been effective so we injected her thumb basal joint today, adding x-rays, meloxicam, home rehab exercises, return in a month for this.

## 2019-08-10 NOTE — Assessment & Plan Note (Signed)
Martha Richardson is also having pain that she localizes over her mid anterolateral tibial shaft, I think this is probably coming from her knee, no paresthesias, nothing running past the ankle. Adding x-rays of her knee and tib-fib on the right. Meloxicam should help this, we can revisit this in a month.

## 2019-08-20 ENCOUNTER — Telehealth: Payer: Self-pay | Admitting: Family Medicine

## 2019-08-20 NOTE — Telephone Encounter (Signed)
Call pt: due for mammo and colon Ca screening.  See what she would like to do.

## 2019-08-25 NOTE — Telephone Encounter (Signed)
LVM asking that she either call or send a my chart on how she would like to proceed with the mammogram and Colon cancer screening.

## 2019-08-25 NOTE — Telephone Encounter (Signed)
Spoke w/pt she is going to call back about screenings

## 2019-09-08 ENCOUNTER — Ambulatory Visit (INDEPENDENT_AMBULATORY_CARE_PROVIDER_SITE_OTHER): Payer: BC Managed Care – PPO | Admitting: Sports Medicine

## 2019-09-08 DIAGNOSIS — Z72820 Sleep deprivation: Secondary | ICD-10-CM | POA: Diagnosis not present

## 2019-09-08 DIAGNOSIS — N951 Menopausal and female climacteric states: Secondary | ICD-10-CM | POA: Diagnosis not present

## 2019-09-08 DIAGNOSIS — M1811 Unilateral primary osteoarthritis of first carpometacarpal joint, right hand: Secondary | ICD-10-CM

## 2019-09-08 DIAGNOSIS — M1711 Unilateral primary osteoarthritis, right knee: Secondary | ICD-10-CM | POA: Diagnosis not present

## 2019-09-08 DIAGNOSIS — M7711 Lateral epicondylitis, right elbow: Secondary | ICD-10-CM | POA: Diagnosis not present

## 2019-09-08 DIAGNOSIS — R5383 Other fatigue: Secondary | ICD-10-CM | POA: Diagnosis not present

## 2019-09-08 DIAGNOSIS — F419 Anxiety disorder, unspecified: Secondary | ICD-10-CM | POA: Diagnosis not present

## 2019-09-08 NOTE — Assessment & Plan Note (Signed)
Tennis elbow brace, rehab exercises, return in 1 month for this.

## 2019-09-08 NOTE — Assessment & Plan Note (Signed)
Martha Richardson returns, she is a pleasant 54 year old female, we are treating her for knee and lower leg pain, she has not improved with conservative treatment, x-rays did show osteoarthritis in the knee, injected today, return in 1 month.

## 2019-09-08 NOTE — Progress Notes (Signed)
    Procedures performed today:    Procedure: Real-time Ultrasound Guided injection of the right knee Device: Samsung HS60  Verbal informed consent obtained.  Time-out conducted.  Noted no overlying erythema, induration, or other signs of local infection.  Skin prepped in a sterile fashion.  Local anesthesia: Topical Ethyl chloride.  With sterile technique and under real time ultrasound guidance: 1 cc Kenalog 40, 2 cc lidocaine, 2 cc bupivacaine injected easily Completed without difficulty  Pain immediately resolved suggesting accurate placement of the medication.  Advised to call if fevers/chills, erythema, induration, drainage, or persistent bleeding.  Images permanently stored and available for review in the ultrasound unit.  Impression: Technically successful ultrasound guided injection.  Independent interpretation of notes and tests performed by another provider:   None.  Brief History, Exam, Impression, and Recommendations:    Primary osteoarthritis of right knee Martha Richardson returns, she is a pleasant 54 year old female, we are treating her for knee and lower leg pain, she has not improved with conservative treatment, x-rays did show osteoarthritis in the knee, injected today, return in 1 month.  Primary osteoarthritis of first carpometacarpal joint of one hand, right Essentially resolved after injection at the last visit. I am going to have her purchase a thumb loop.  Lateral epicondylitis, right elbow Tennis elbow brace, rehab exercises, return in 1 month for this.    ___________________________________________ Ihor Austin. Benjamin Stain, M.D., ABFM., CAQSM. Primary Care and Sports Medicine [place] [facility]  Adjunct Instructor of Family Medicine  [employer]

## 2019-09-08 NOTE — Assessment & Plan Note (Signed)
Essentially resolved after injection at the last visit. I am going to have her purchase a thumb loop.

## 2019-10-06 ENCOUNTER — Ambulatory Visit: Payer: BC Managed Care – PPO | Admitting: Sports Medicine

## 2019-10-06 DIAGNOSIS — E039 Hypothyroidism, unspecified: Secondary | ICD-10-CM | POA: Diagnosis not present

## 2019-10-06 DIAGNOSIS — Z1231 Encounter for screening mammogram for malignant neoplasm of breast: Secondary | ICD-10-CM | POA: Diagnosis not present

## 2019-10-06 DIAGNOSIS — Z6828 Body mass index (BMI) 28.0-28.9, adult: Secondary | ICD-10-CM | POA: Diagnosis not present

## 2019-10-06 DIAGNOSIS — Z01419 Encounter for gynecological examination (general) (routine) without abnormal findings: Secondary | ICD-10-CM | POA: Diagnosis not present

## 2019-11-07 DIAGNOSIS — N200 Calculus of kidney: Secondary | ICD-10-CM | POA: Diagnosis not present

## 2019-11-07 DIAGNOSIS — R109 Unspecified abdominal pain: Secondary | ICD-10-CM | POA: Diagnosis not present

## 2020-02-28 DIAGNOSIS — J3489 Other specified disorders of nose and nasal sinuses: Secondary | ICD-10-CM | POA: Diagnosis not present

## 2020-02-28 DIAGNOSIS — M950 Acquired deformity of nose: Secondary | ICD-10-CM | POA: Diagnosis not present

## 2020-02-28 DIAGNOSIS — J343 Hypertrophy of nasal turbinates: Secondary | ICD-10-CM | POA: Diagnosis not present

## 2020-02-28 DIAGNOSIS — J342 Deviated nasal septum: Secondary | ICD-10-CM | POA: Diagnosis not present

## 2020-03-22 ENCOUNTER — Telehealth: Payer: Self-pay | Admitting: Family Medicine

## 2020-03-22 NOTE — Telephone Encounter (Signed)
Please call ptand let her know she overdue for mammo and pap. We are happy to schedule her here or if she sees gyn plase let us know so we can get records.

## 2020-03-23 NOTE — Telephone Encounter (Signed)
I called pt and left a Voicemail informing her she was overdue for pap and mammo and to call and schedule this or to let us know if she has already completed these somewhere else.

## 2020-05-23 ENCOUNTER — Telehealth: Payer: Self-pay | Admitting: *Deleted

## 2020-05-23 DIAGNOSIS — Z114 Encounter for screening for human immunodeficiency virus [HIV]: Secondary | ICD-10-CM

## 2020-05-23 DIAGNOSIS — Z0000 Encounter for general adult medical examination without abnormal findings: Secondary | ICD-10-CM

## 2020-05-23 NOTE — Telephone Encounter (Signed)
Pt lvm asking that she get labwork done prior to her appt on 05/26/20.   Called pt to let her know that labs have been ordered.

## 2020-05-24 DIAGNOSIS — Z114 Encounter for screening for human immunodeficiency virus [HIV]: Secondary | ICD-10-CM | POA: Diagnosis not present

## 2020-05-24 DIAGNOSIS — Z0000 Encounter for general adult medical examination without abnormal findings: Secondary | ICD-10-CM | POA: Diagnosis not present

## 2020-05-26 ENCOUNTER — Ambulatory Visit (INDEPENDENT_AMBULATORY_CARE_PROVIDER_SITE_OTHER): Payer: BC Managed Care – PPO | Admitting: Family Medicine

## 2020-05-26 ENCOUNTER — Other Ambulatory Visit: Payer: Self-pay

## 2020-05-26 ENCOUNTER — Encounter: Payer: Self-pay | Admitting: Family Medicine

## 2020-05-26 VITALS — BP 118/75 | HR 83 | Ht 63.0 in | Wt 153.0 lb

## 2020-05-26 DIAGNOSIS — Z0000 Encounter for general adult medical examination without abnormal findings: Secondary | ICD-10-CM

## 2020-05-26 DIAGNOSIS — Z1211 Encounter for screening for malignant neoplasm of colon: Secondary | ICD-10-CM

## 2020-05-26 DIAGNOSIS — F1020 Alcohol dependence, uncomplicated: Secondary | ICD-10-CM

## 2020-05-26 DIAGNOSIS — Z23 Encounter for immunization: Secondary | ICD-10-CM | POA: Diagnosis not present

## 2020-05-26 DIAGNOSIS — R748 Abnormal levels of other serum enzymes: Secondary | ICD-10-CM

## 2020-05-26 LAB — POCT GLYCOSYLATED HEMOGLOBIN (HGB A1C): Hemoglobin A1C: 5.7 % — AB (ref 4.0–5.6)

## 2020-05-26 NOTE — Progress Notes (Addendum)
Subjective:     Martha Richardson is a 55 y.o. female and is here for a comprehensive physical exam. The patient reports problems - extreme fatigue.  She feels like she eats pretty healthy.  She also complains of extreme fatigue that is been present since about January when she had an 8 no surgery.  They were correcting a deviation and cartilage issues who feels like she is doing much better in regards to her breathing and even being able to sleep better.  She did not have any complications from the surgery but has just felt extremely tired since then.  No recent chest pain shortness of breath fevers, abnormal weight loss.  He does occasionally get a few red bumps across her chest she says they are really not bothersome or itchy and then they do tend to go away.  But has not noticed any other distinct rashes.  Social History   Socioeconomic History  . Marital status: Married    Spouse name: Not on file  . Number of children: Not on file  . Years of education: Not on file  . Highest education level: Not on file  Occupational History  . Not on file  Tobacco Use  . Smoking status: Current Some Day Smoker    Packs/day: 0.25    Types: Cigarettes  . Smokeless tobacco: Never Used  Substance and Sexual Activity  . Alcohol use: Yes    Alcohol/week: 4.0 - 6.0 standard drinks    Types: 4 - 6 Glasses of wine per week  . Drug use: No  . Sexual activity: Not on file  Other Topics Concern  . Not on file  Social History Narrative  . Not on file   Social Determinants of Health   Financial Resource Strain: Not on file  Food Insecurity: Not on file  Transportation Needs: Not on file  Physical Activity: Not on file  Stress: Not on file  Social Connections: Not on file  Intimate Partner Violence: Not on file   Health Maintenance  Topic Date Due  . MAMMOGRAM  10/17/2014  . PAP SMEAR-Modifier  03/23/2017  . COLONOSCOPY (Pts 45-63yrs Insurance coverage will need to be confirmed)  08/24/2017  .  COVID-19 Vaccine (3 - Booster for Pfizer series) 06/11/2021 (Originally 09/20/2019)  . INFLUENZA VACCINE  09/18/2020  . TETANUS/TDAP  05/27/2030  . Hepatitis C Screening  Completed  . HIV Screening  Completed  . HPV VACCINES  Aged Out    The following portions of the patient's history were reviewed and updated as appropriate: allergies, current medications, past family history, past medical history, past social history, past surgical history and problem list.  Review of Systems A comprehensive review of systems was negative.   Objective:    BP 118/75   Pulse 83   Ht 5\' 3"  (1.6 m)   Wt 153 lb (69.4 kg)   SpO2 99%   BMI 27.10 kg/m  General appearance: alert, cooperative and appears stated age Head: Normocephalic, without obvious abnormality, atraumatic Eyes: conj clear, EOMI, PEERLA Ears: normal TM's and external ear canals both ears Nose: Nares normal. Septum midline. Mucosa normal. No drainage or sinus tenderness. Throat: lips, mucosa, and tongue normal; teeth and gums normal Neck: no adenopathy, no carotid bruit, no JVD, supple, symmetrical, trachea midline and thyroid not enlarged, symmetric, no tenderness/mass/nodules Back: symmetric, no curvature. ROM normal. No CVA tenderness. Lungs: clear to auscultation bilaterally Heart: regular rate and rhythm, S1, S2 normal, no murmur, click, rub or gallop  Abdomen: soft, non-tender; bowel sounds normal; no masses,  no organomegaly Extremities: extremities normal, atraumatic, no cyanosis or edema Pulses: 2+ and symmetric Skin: Skin color, texture, turgor normal. No rashes or lesions Lymph nodes: Cervical adenopathy: nl and Supraclavicular adenopathy: nl Neurologic: Alert and oriented X 3, normal strength and tone. Normal symmetric reflexes. Normal coordination and gait    Assessment:    Healthy female exam.      Plan:     See After Visit Summary for Counseling Recommendations   Keep up a regular exercise program and make sure you  are eating a healthy diet Try to eat 4 servings of dairy a day, or if you are lactose intolerant take a calcium with vitamin D daily.  Your vaccines are up to date.  Tetanus and shingles vaccine given today.  Extreme fatigue-we will see if we can add a B12 folate and B1 to her labs.  She did have an elevated MCV.  She also had significantly elevated liver enzymes higher than her usual elevation we discussed the importance of really cutting back on her alcohol use.  She has been drinking vodka daily.  Also want a get an up-to-date ultrasound of her liver.  Last one was probably about 5 years ago.   Orders Placed This Encounter  Procedures  . US Abdomen Complete    Standing Status:   Future    Standing Expiration Date:   05/26/2021    Order Specific Question:   Reason for Exam (SYMPTOM  OR DIAGNOSIS REQUIRED)    Answer:   Elevated liver enzymes.    Order Specific Question:   Preferred imaging location?    Answer:   [facility]  . Varicella-zoster vaccine IM (Shingrix)  . Tdap vaccine greater than or equal to 7yo IM  . Ambulatory referral to Gastroenterology    Referral Priority:   Routine    Referral Type:   Consultation    Referral Reason:   Specialty Services Required    Number of Visits Requested:   1  . POCT glycosylated hemoglobin (Hb A1C)

## 2020-05-26 NOTE — Addendum Note (Signed)
Addended by: Nani Gasser D on: 05/26/2020 10:38 AM   Modules accepted: Orders

## 2020-05-27 LAB — COMPLETE METABOLIC PANEL WITH GFR
AG Ratio: 2 (calc) (ref 1.0–2.5)
ALT: 89 U/L — ABNORMAL HIGH (ref 6–29)
AST: 83 U/L — ABNORMAL HIGH (ref 10–35)
Albumin: 4.4 g/dL (ref 3.6–5.1)
Alkaline phosphatase (APISO): 76 U/L (ref 37–153)
BUN: 9 mg/dL (ref 7–25)
CO2: 26 mmol/L (ref 20–32)
Calcium: 9.7 mg/dL (ref 8.6–10.4)
Chloride: 103 mmol/L (ref 98–110)
Creat: 0.7 mg/dL (ref 0.50–1.05)
GFR, Est African American: 113 mL/min/{1.73_m2} (ref >=60)
GFR, Est Non African American: 98 mL/min/{1.73_m2} (ref >=60)
Globulin: 2.2 g/dL (calc) (ref 1.9–3.7)
Glucose, Bld: 101 mg/dL — ABNORMAL HIGH (ref 65–99)
Potassium: 4.9 mmol/L (ref 3.5–5.3)
Sodium: 140 mmol/L (ref 135–146)
Total Bilirubin: 0.7 mg/dL (ref 0.2–1.2)
Total Protein: 6.6 g/dL (ref 6.1–8.1)

## 2020-05-27 LAB — B12 AND FOLATE PANEL
Folate: 3.5 ng/mL — ABNORMAL LOW
Vitamin B-12: 352 pg/mL (ref 200–1100)

## 2020-05-27 LAB — LIPID PANEL W/REFLEX DIRECT LDL
Cholesterol: 204 mg/dL — ABNORMAL HIGH (ref <200)
HDL: 73 mg/dL (ref >=50)
LDL Cholesterol (Calc): 99 mg/dL (calc)
Non-HDL Cholesterol (Calc): 131 mg/dL (calc) — ABNORMAL HIGH (ref <130)
Total CHOL/HDL Ratio: 2.8 (calc) (ref <5.0)
Triglycerides: 203 mg/dL — ABNORMAL HIGH (ref <150)

## 2020-05-27 LAB — CBC
HCT: 44.5 % (ref 35.0–45.0)
Hemoglobin: 15.4 g/dL (ref 11.7–15.5)
MCH: 36.8 pg — ABNORMAL HIGH (ref 27.0–33.0)
MCHC: 34.6 g/dL (ref 32.0–36.0)
MCV: 106.5 fL — ABNORMAL HIGH (ref 80.0–100.0)
MPV: 9.8 fL (ref 7.5–12.5)
Platelets: 285 10*3/uL (ref 140–400)
RBC: 4.18 10*6/uL (ref 3.80–5.10)
RDW: 13 % (ref 11.0–15.0)
WBC: 6.8 10*3/uL (ref 3.8–10.8)

## 2020-05-27 LAB — TSH: TSH: 1.57 mIU/L

## 2020-05-27 LAB — HIV ANTIBODY (ROUTINE TESTING W REFLEX): HIV 1&2 Ab, 4th Generation: NONREACTIVE

## 2020-05-30 ENCOUNTER — Encounter: Payer: Self-pay | Admitting: Family Medicine

## 2020-05-30 ENCOUNTER — Other Ambulatory Visit: Payer: Self-pay | Admitting: *Deleted

## 2020-05-30 DIAGNOSIS — E538 Deficiency of other specified B group vitamins: Secondary | ICD-10-CM

## 2020-05-30 DIAGNOSIS — R5383 Other fatigue: Secondary | ICD-10-CM

## 2020-05-30 DIAGNOSIS — R748 Abnormal levels of other serum enzymes: Secondary | ICD-10-CM

## 2020-05-30 DIAGNOSIS — Z0000 Encounter for general adult medical examination without abnormal findings: Secondary | ICD-10-CM

## 2020-06-01 ENCOUNTER — Encounter: Payer: Self-pay | Admitting: Physician Assistant

## 2020-06-01 DIAGNOSIS — E538 Deficiency of other specified B group vitamins: Secondary | ICD-10-CM | POA: Insufficient documentation

## 2020-06-01 NOTE — Telephone Encounter (Signed)
Denny Peon,   This is Tandy Gaw PA-C covering for Dr. Judie Petit.   Liver enzymes up some. How much alcohol have you been drinking?   LDL, bad cholesterol, good. HDL, good cholesterol, great.   TG and glucose a little elevated. Add A1C.   Thyroid normal.   B12 ok. Folate is low. Are you taking any folate? Start 1mg  folic acid daily.

## 2020-06-09 ENCOUNTER — Other Ambulatory Visit: Payer: BC Managed Care – PPO

## 2020-06-12 ENCOUNTER — Ambulatory Visit (INDEPENDENT_AMBULATORY_CARE_PROVIDER_SITE_OTHER): Payer: BC Managed Care – PPO

## 2020-06-12 ENCOUNTER — Other Ambulatory Visit: Payer: Self-pay

## 2020-06-12 DIAGNOSIS — R748 Abnormal levels of other serum enzymes: Secondary | ICD-10-CM

## 2020-06-12 DIAGNOSIS — R5383 Other fatigue: Secondary | ICD-10-CM | POA: Diagnosis not present

## 2020-06-12 DIAGNOSIS — F1020 Alcohol dependence, uncomplicated: Secondary | ICD-10-CM | POA: Diagnosis not present

## 2020-06-12 DIAGNOSIS — K760 Fatty (change of) liver, not elsewhere classified: Secondary | ICD-10-CM | POA: Diagnosis not present

## 2020-06-12 DIAGNOSIS — Z0000 Encounter for general adult medical examination without abnormal findings: Secondary | ICD-10-CM | POA: Diagnosis not present

## 2020-06-12 DIAGNOSIS — K7689 Other specified diseases of liver: Secondary | ICD-10-CM | POA: Diagnosis not present

## 2020-06-12 DIAGNOSIS — N200 Calculus of kidney: Secondary | ICD-10-CM | POA: Diagnosis not present

## 2020-06-15 LAB — VITAMIN B1: Vitamin B1 (Thiamine): 8 nmol/L (ref 8–30)

## 2020-06-16 ENCOUNTER — Encounter: Payer: Self-pay | Admitting: *Deleted

## 2020-06-16 IMAGING — CT CT HEAD CODE STROKE W/O CM
3 of 4 series · 15 of 47 positions shown, 18 images · non-contrast
Comparison: No pertinent prior studies available for comparison.

CLINICAL DATA: Code stroke. Focal neuro deficit, greater than 6
hours, stroke suspected. Additional history: Last known normal 11
a.m., slurred speech, left-sided weakness, confusion.

EXAM:
CT HEAD WITHOUT CONTRAST
TECHNIQUE: Contiguous axial images were obtained from the base of the skull
through the vertex without intravenous contrast.

[Series 4: head 2.0 h70h · axial · 0.45mm/px · z∈[-115,+17]mm · 9 of 84 slices shown, 12 images]
[im 9/84  brain]
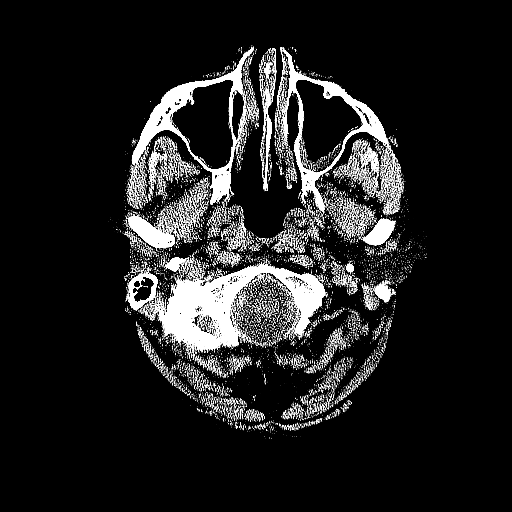
[im 9/84  bone]
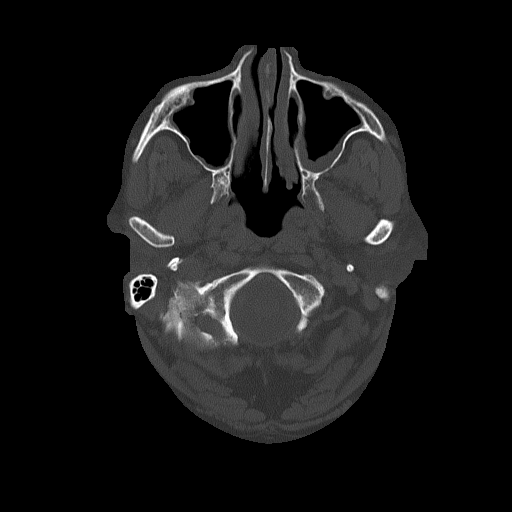
[im 17/84  brain]
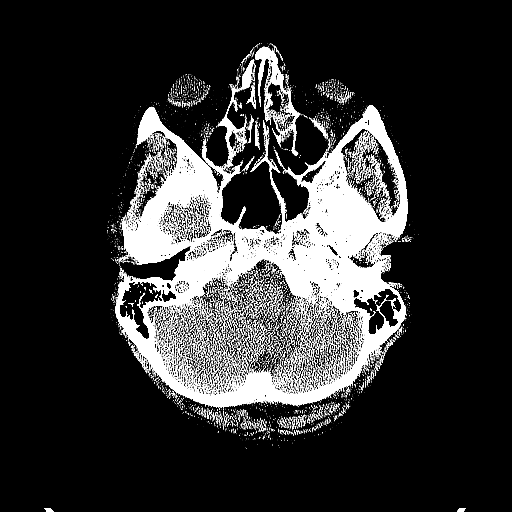
[im 25/84  brain]
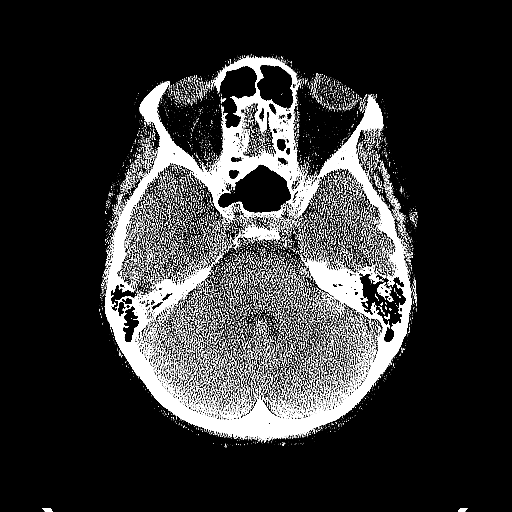
[im 34/84  brain]
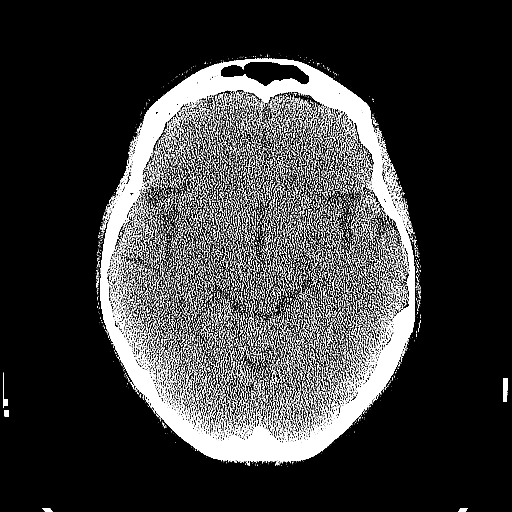
[im 42/84  brain]
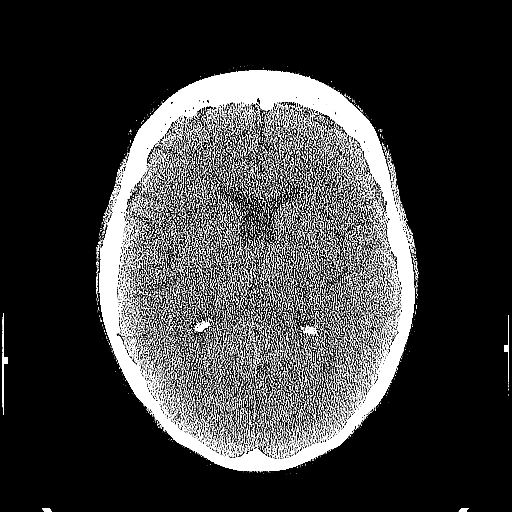
[im 42/84  bone]
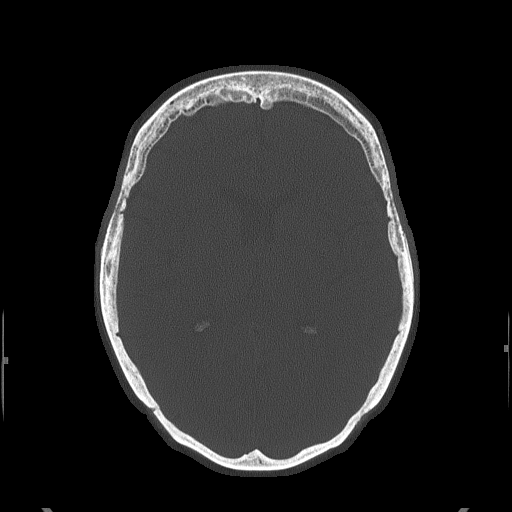
[im 50/84  brain]
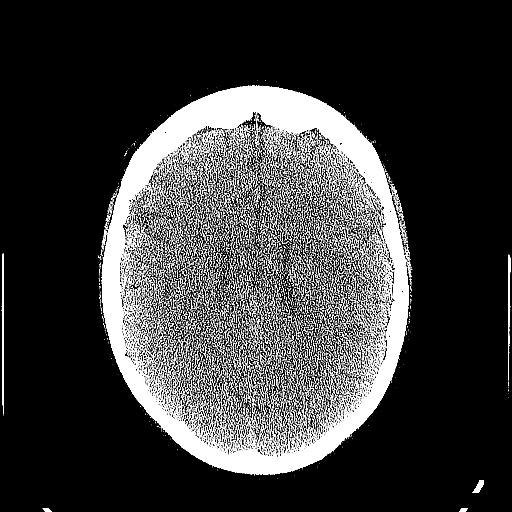
[im 59/84  brain]
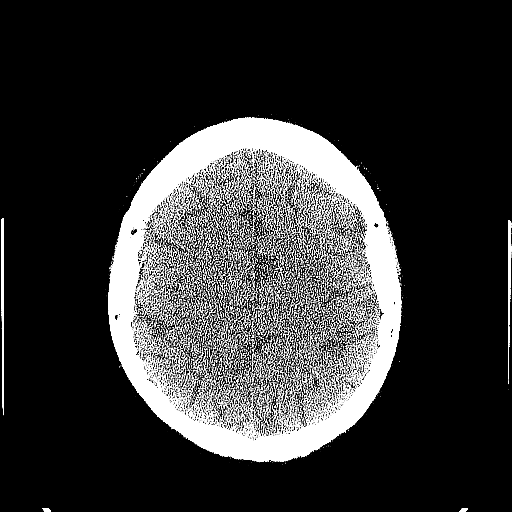
[im 67/84  brain]
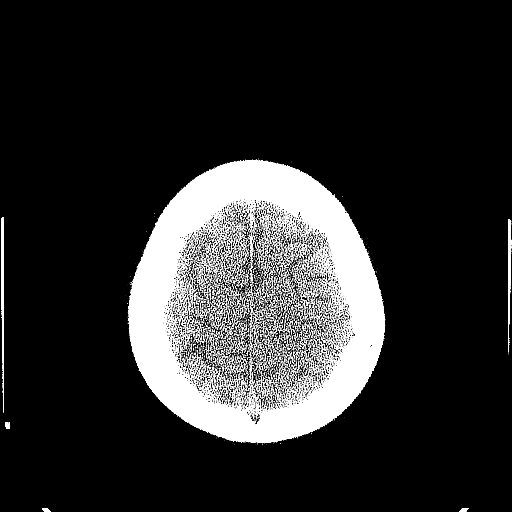
[im 75/84  brain]
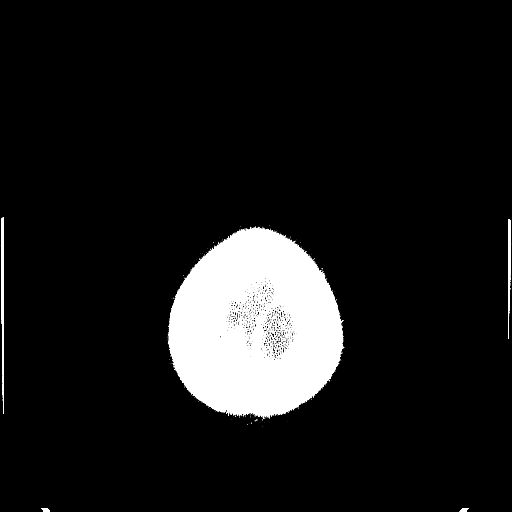
[im 75/84  bone]
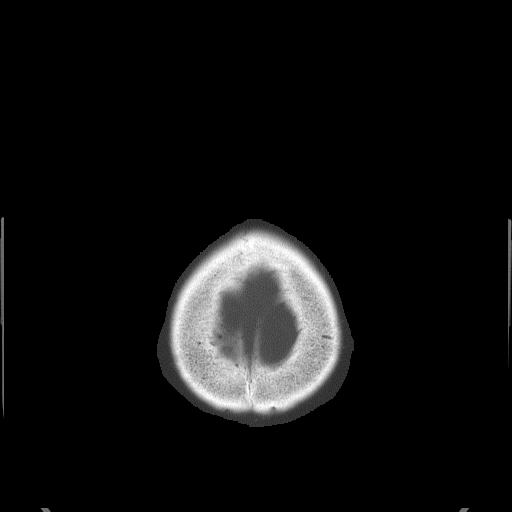

[Series 5: head 3.0 mpr cor · coronal · 0.34mm/px · 3 of 65 slices shown]
[im 22/65  brain]
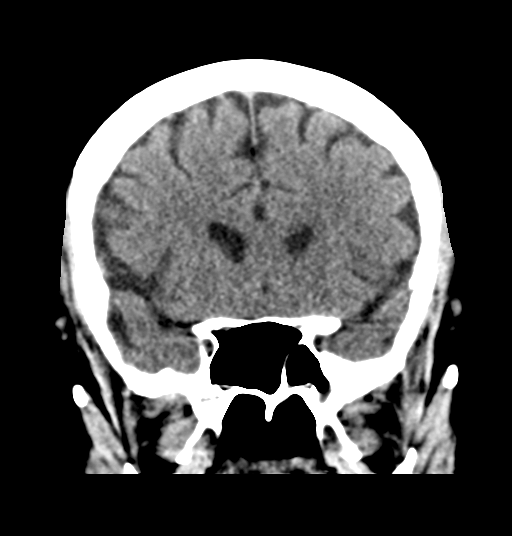
[im 29/65  brain]
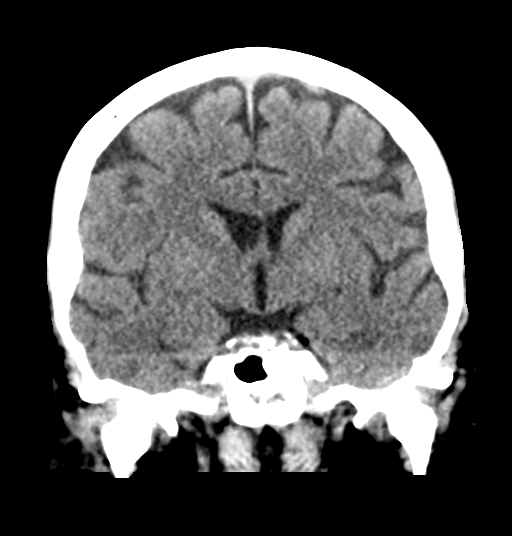
[im 36/65  brain]
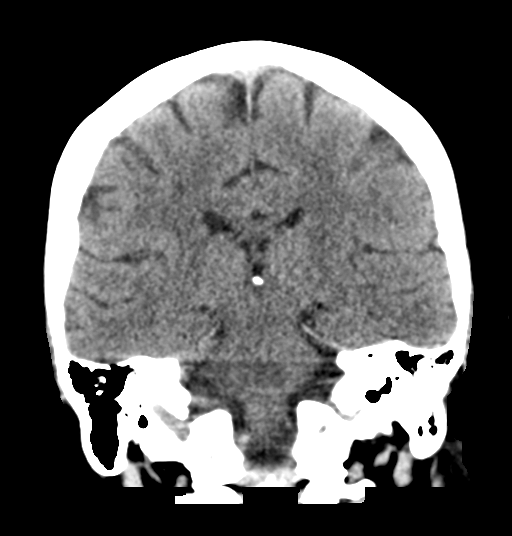

[Series 6: head 3.0 mpr sag · sagittal · 0.32mm/px · 3 of 67 slices shown]
[im 23/67  brain]
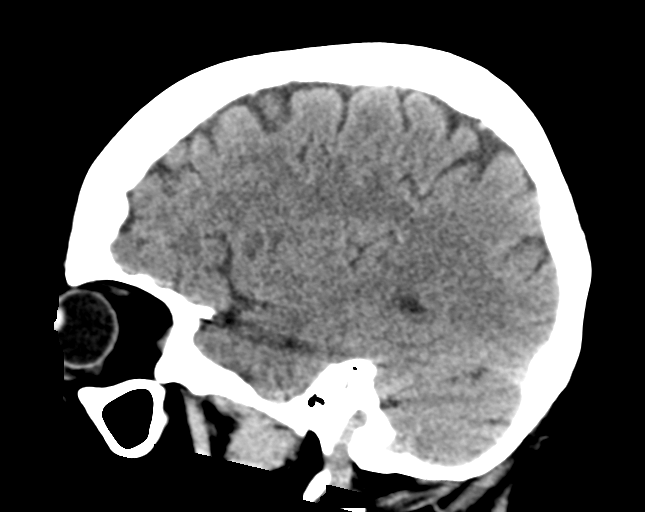
[im 34/67  brain]
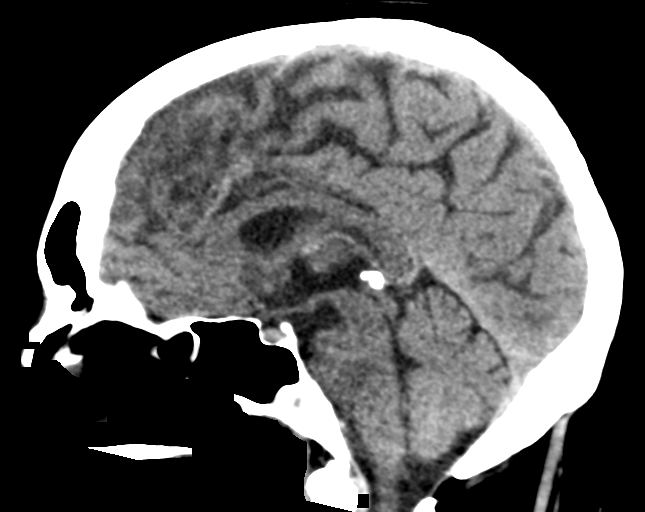
[im 45/67  brain]
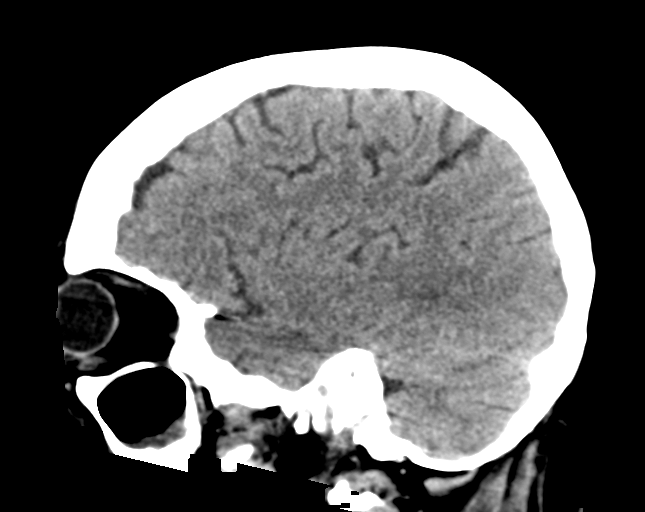

[15 of 47 positions shown; findings below may reference images not displayed]

FINDINGS: Brain: There is no acute intracranial hemorrhage or demarcated
territorial infarction. No evidence of intracranial mass. No midline
shift or extra-axial collection. Cerebral volume is age appropriate.

Vascular: No definite hyperdense vessel.

Skull: No calvarial fracture.

Sinuses/Orbits: Imaged globes and orbits demonstrate no acute
abnormality. Moderate bilateral ethmoid sinus mucosal thickening.
Mild mucosal thickening within the bilateral maxillary and sphenoid
sinuses. No significant mastoid effusion.

Other: Age-indeterminate fractures of the bilateral nasal bones.

ASPECTS (Alberta Stroke Program Early CT Score)

- Ganglionic level infarction (caudate, lentiform nuclei, internal
capsule, insula, M1-M3 cortex): 7

- Supraganglionic infarction (M4-M6 cortex): 3

Total score (0-10 with 10 being normal): 10

These results were communicated to Dr. Mand at [DATE] pmon
10/06/2018by text page via the [employer] messaging system.
IMPRESSION: No acute intracranial hemorrhage or demarcated territorial
infarction. ASPECTS 10. Correlate with findings on concurrent CTA
head/neck. Additionally, consider brain MRI for further evaluation.

Age intermittent bilateral nasal bone fractures.

Paranasal sinus disease as described.

## 2020-06-16 NOTE — Addendum Note (Signed)
Addended by: Deno Etienne on: 06/16/2020 10:31 AM   Modules accepted: Orders

## 2020-06-16 NOTE — Progress Notes (Unsigned)
error 

## 2020-09-05 ENCOUNTER — Ambulatory Visit: Payer: BC Managed Care – PPO | Admitting: Internal Medicine

## 2020-09-06 ENCOUNTER — Encounter: Payer: Self-pay | Admitting: Gastroenterology

## 2020-09-06 ENCOUNTER — Ambulatory Visit: Payer: BC Managed Care – PPO | Admitting: Gastroenterology

## 2020-09-06 DIAGNOSIS — Z800 Family history of malignant neoplasm of digestive organs: Secondary | ICD-10-CM | POA: Diagnosis not present

## 2020-09-06 DIAGNOSIS — R7989 Other specified abnormal findings of blood chemistry: Secondary | ICD-10-CM | POA: Diagnosis not present

## 2020-09-06 NOTE — Patient Instructions (Signed)
If you are age 56 or younger, your body mass index should be between 19-25. Your Body mass index is 28.26 kg/m. If this is out of the aformentioned range listed, please consider follow up with your Primary Care Provider.  _________________________________________________________  The [facility] providers would like to encourage you to use [facility] to communicate with providers for non-urgent requests or questions.  Due to long hold times on the telephone, sending your provider a message by [facility] may be a faster and more efficient way to get a response.  Please allow 48 business hours for a response.  Please remember that this is for non-urgent requests.  _________________________________________________________ Martha Richardson have been scheduled for a colonoscopy. Please follow written instructions given to you at your visit today.  Please pick up your prep supplies at the pharmacy within the next 1-3 days. If you use inhalers (even only as needed), please bring them with you on the day of your procedure.  Due to recent changes in healthcare laws, you may see the results of your imaging and laboratory studies on MyChart before your provider has had a chance to review them.  We understand that in some cases there may be results that are confusing or concerning to you. Not all laboratory results come back in the same time frame and the provider may be waiting for multiple results in order to interpret others.  Please give [place] 48 hours in order for your provider to thoroughly review all the results before contacting the office for clarification of your results.   Your provider has requested that you go to the basement level for lab work before leaving today. Press "B" on the elevator. The lab is located at the first door on the left as you exit the elevator.  Due to recent changes in healthcare laws, you may see the results of your imaging and laboratory studies on MyChart before your provider has had a chance to  review them.  We understand that in some cases there may be results that are confusing or concerning to you. Not all laboratory results come back in the same time frame and the provider may be waiting for multiple results in order to interpret others.  Please give [place] 48 hours in order for your provider to thoroughly review all the results before contacting the office for clarification of your results.   Please decrease your alcohol intake.  Thank you for entrusting me with your care and choosing [facility].  Dr Christella Hartigan

## 2020-09-06 NOTE — Progress Notes (Signed)
HPI: This is a very pleasant 55 year old woman who was referred to me by Agapito Games, *  to evaluate elevated liver tests, family history of colon cancer.    Up until a month or 2 ago she was drinking a full bottle of wine just about every day.  She has gone to [facility] in the past and has had alcohol counseling in the past.  Alcoholism clearly runs in her family.  She cut back to drinking about half of her previous amount and so she is drinking 2 glasses of wine daily now.  Sometimes she will substitute vodka for this.  She has never had a DUI.  She has elevated liver transaminases.  Blood work April 2022 shows AST 83, ALT 89.  These date back at least 7 years.  In 2015 her AST was 53 and her ALT was 59.  In the interim they have been as high as AST 106, ALT 116.  The rest of her liver tests have always been completely normal as far back as I can see.  Blood work April 2020, hepatitis A total antibody reactive, hepatitis B surface antibody reactive, hepatitis B surface antigen negative, hepatitis B core antibody negative, hepatitis C antibody negative.  Abdominal ultrasound with elastography June 2020 indication "abnormal liver tests" findings simple cyst in the liver.  Otherwise the liver appeared normal.  Elastography corresponded with fibrosis score F2 0/F1, extremely low risk of fibrosis.  Abdominal ultrasound April 2022 indication abnormal liver tests, findings unchanged cyst in the liver.  + Hepatic steatosis.  CBC April 2022 normal except for MCV of 107, in the year 2020 her MCV was 104, in the year 2012 her MCV was 103.   Review of systems: Pertinent positive and negative review of systems were noted in the above HPI section. All other review negative.   Past Medical History:  Diagnosis Date   ALCOHOL ABUSE 06/13/2008   Qualifier: Diagnosis of  By: Linford Arnold MD, Catherine     Anxiety    Arthritis    COUGH, CHRONIC 07/08/2008   Qualifier: Diagnosis of  By:  Linford Arnold MD, Catherine     Kidney stones    LUNG NODULE 07/10/2008   Qualifier: Diagnosis of  By: Linford Arnold MD, Santina Evans      Past Surgical History:  Procedure Laterality Date   NASAL SEPTUM SURGERY Left    x 2   RHINOPLASTY Left     Current Outpatient Medications  Medication Sig Dispense Refill   progesterone (PROMETRIUM) 200 MG capsule Take 200 mg by mouth at bedtime.     No current facility-administered medications for this visit.    Allergies as of 09/06/2020   (No Active Allergies)    Family History  Problem Relation Age of Onset   Hypertension Father    Colon cancer Father 52   Diabetes Father    COPD Brother    Emphysema Brother    Hypertension Brother    Heart disease Paternal Grandmother    Stomach cancer Paternal Grandfather     Social History   Socioeconomic History   Marital status: Married    Spouse name: Not on file   Number of children: 1   Years of education: Not on file   Highest education level: Not on file  Occupational History   Occupation: Marine scientist  Tobacco Use   Smoking status: Every Day    Packs/day: 0.25    Types: Cigarettes   Smokeless tobacco: Never  Vaping Use  Vaping Use: Never used  Substance and Sexual Activity   Alcohol use: Yes    Alcohol/week: 4.0 - 6.0 standard drinks    Types: 4 - 6 Glasses of wine per week    Comment: couple of glasses of wine a night   Drug use: No   Sexual activity: Not on file  Other Topics Concern   Not on file  Social History Narrative   Not on file   Social Determinants of Health   Financial Resource Strain: Not on file  Food Insecurity: Not on file  Transportation Needs: Not on file  Physical Activity: Not on file  Stress: Not on file  Social Connections: Not on file  Intimate Partner Violence: Not on file     Physical Exam: BP 120/80 (BP Location: Left Arm, Patient Position: Sitting, Cuff Size: Normal)   Pulse 88   Ht 5' 2.5" (1.588 m) Comment: height measured without  shoes  Wt 157 lb (71.2 kg)   BMI 28.26 kg/m  Constitutional: generally well-appearing Psychiatric: alert and oriented x3 Eyes: extraocular movements intact Mouth: oral pharynx moist, no lesions Neck: supple no lymphadenopathy Cardiovascular: heart regular rate and rhythm Lungs: clear to auscultation bilaterally Abdomen: soft, nontender, nondistended, no obvious ascites, no peritoneal signs, normal bowel sounds Extremities: no lower extremity edema bilaterally Skin: no lesions on visible extremities   Assessment and plan: 55 y.o. female with alcohol abuse, family history of colon cancer, elevated liver tests  She understands that her elevated liver transaminases are probably from her abuse of alcohol.  She used to drink a full bottle of wine just about every day.  She drinks about half that much now.  I explained to her that she should try to continue to cut back so that she is drinking no more than once every week or 2.  She has been to [facility] in the past.  I recommend that she consider going back.    She also understands that there is a small chance of other primary liver diseases could be causing her liver tests to be elevated.  I recommended our usual battery of blood tests including autoimmune markers, iron overload markers.  She has already been tested for hepatitis A, B and C.  See those results summarized above.  In addition to this better blood test she will get a CBC, complete metabolic profile and coags.  At this point I do not think she has cirrhosis.  She is at increased risk for colon cancer since her father was diagnosed with colon cancer in her 41s and her last colonoscopy was 14 or 15 years ago.  I recommended she have a repeat screening examination of her colon now and she agreed.  I see no reason for any further blood tests or imaging studies other than those described above.   Please see the "Patient Instructions" section for addition details about the  plan.   Rob Bunting, MD [facility] 09/06/2020, 3:06 PM  Cc: Agapito Games, *  Total time on date of encounter was 45 minutes (this included time spent preparing to see the patient reviewing records; obtaining and/or reviewing separately obtained history; performing a medically appropriate exam and/or evaluation; counseling and educating the patient and family if present; ordering medications, tests or procedures if applicable; and documenting clinical information in the health record).

## 2020-09-07 ENCOUNTER — Other Ambulatory Visit (INDEPENDENT_AMBULATORY_CARE_PROVIDER_SITE_OTHER): Payer: BC Managed Care – PPO

## 2020-09-07 DIAGNOSIS — Z800 Family history of malignant neoplasm of digestive organs: Secondary | ICD-10-CM

## 2020-09-07 DIAGNOSIS — R7989 Other specified abnormal findings of blood chemistry: Secondary | ICD-10-CM | POA: Diagnosis not present

## 2020-09-07 LAB — CBC
HCT: 42.8 % (ref 36.0–46.0)
Hemoglobin: 14.6 g/dL (ref 12.0–15.0)
MCHC: 34.2 g/dL (ref 30.0–36.0)
MCV: 107 fl — ABNORMAL HIGH (ref 78.0–100.0)
Platelets: 315 10*3/uL (ref 150.0–400.0)
RBC: 4 Mil/uL (ref 3.87–5.11)
RDW: 13.4 % (ref 11.5–15.5)
WBC: 9 10*3/uL (ref 4.0–10.5)

## 2020-09-07 LAB — COMPREHENSIVE METABOLIC PANEL
ALT: 106 U/L — ABNORMAL HIGH (ref 0–35)
AST: 139 U/L — ABNORMAL HIGH (ref 0–37)
Albumin: 4.4 g/dL (ref 3.5–5.2)
Alkaline Phosphatase: 72 U/L (ref 39–117)
BUN: 10 mg/dL (ref 6–23)
CO2: 20 mEq/L (ref 19–32)
Calcium: 9.5 mg/dL (ref 8.4–10.5)
Chloride: 102 mEq/L (ref 96–112)
Creatinine, Ser: 0.75 mg/dL (ref 0.40–1.20)
GFR: 89.33 mL/min (ref >60.00)
Glucose, Bld: 136 mg/dL — ABNORMAL HIGH (ref 70–99)
Potassium: 4.1 mEq/L (ref 3.5–5.1)
Sodium: 136 mEq/L (ref 135–145)
Total Bilirubin: 0.5 mg/dL (ref 0.2–1.2)
Total Protein: 7 g/dL (ref 6.0–8.3)

## 2020-09-07 LAB — PROTIME-INR
INR: 0.9 ratio (ref 0.8–1.0)
Prothrombin Time: 10.3 s (ref 9.6–13.1)

## 2020-09-07 LAB — IBC + FERRITIN
Ferritin: 379.1 ng/mL — ABNORMAL HIGH (ref 10.0–291.0)
Iron: 199 ug/dL — ABNORMAL HIGH (ref 42–145)
Saturation Ratios: 45 % (ref 20.0–50.0)
Transferrin: 316 mg/dL (ref 212.0–360.0)

## 2020-09-07 NOTE — Addendum Note (Signed)
Addended by: Elita Boone E on: 09/07/2020 08:34 AM   Modules accepted: Orders

## 2020-09-11 ENCOUNTER — Other Ambulatory Visit: Payer: Self-pay

## 2020-09-11 DIAGNOSIS — R7989 Other specified abnormal findings of blood chemistry: Secondary | ICD-10-CM

## 2020-09-14 LAB — ANTI-SMOOTH MUSCLE ANTIBODY, IGG: Actin (Smooth Muscle) Antibody (IGG): <20 U (ref <20)

## 2020-09-14 LAB — IGA: Immunoglobulin A: 215 mg/dL (ref 47–310)

## 2020-09-14 LAB — TISSUE TRANSGLUTAMINASE, IGA: (tTG) Ab, IgA: <1 U/mL

## 2020-09-14 LAB — ANA: Anti Nuclear Antibody (ANA): NEGATIVE

## 2020-09-14 LAB — MITOCHONDRIAL ANTIBODIES: Mitochondrial M2 Ab, IgG: <=20 U

## 2020-09-18 DIAGNOSIS — Z20822 Contact with and (suspected) exposure to covid-19: Secondary | ICD-10-CM | POA: Diagnosis not present

## 2020-10-11 ENCOUNTER — Encounter: Payer: BC Managed Care – PPO | Admitting: Gastroenterology

## 2020-10-31 ENCOUNTER — Telehealth: Payer: Self-pay | Admitting: Gastroenterology

## 2020-10-31 NOTE — Telephone Encounter (Signed)
Good morning Dr. Christella Hartigan,   Patient called in to cancel procedure 9/14 due to transportation/support person. She states she will call back to reschedule once she have the schedule of someone to help her.   Thank you.

## 2020-11-01 ENCOUNTER — Encounter: Payer: BC Managed Care – PPO | Admitting: Gastroenterology

## 2020-11-16 DIAGNOSIS — Z1231 Encounter for screening mammogram for malignant neoplasm of breast: Secondary | ICD-10-CM | POA: Diagnosis not present

## 2020-11-16 DIAGNOSIS — Z01419 Encounter for gynecological examination (general) (routine) without abnormal findings: Secondary | ICD-10-CM | POA: Diagnosis not present

## 2020-11-16 DIAGNOSIS — Z6827 Body mass index (BMI) 27.0-27.9, adult: Secondary | ICD-10-CM | POA: Diagnosis not present

## 2021-04-02 DIAGNOSIS — B349 Viral infection, unspecified: Secondary | ICD-10-CM | POA: Diagnosis not present

## 2021-04-02 DIAGNOSIS — R051 Acute cough: Secondary | ICD-10-CM | POA: Diagnosis not present

## 2021-04-02 DIAGNOSIS — R0982 Postnasal drip: Secondary | ICD-10-CM | POA: Diagnosis not present

## 2021-04-02 DIAGNOSIS — R0981 Nasal congestion: Secondary | ICD-10-CM | POA: Diagnosis not present

## 2021-05-09 DIAGNOSIS — H04201 Unspecified epiphora, right lacrimal gland: Secondary | ICD-10-CM | POA: Diagnosis not present

## 2021-07-30 DIAGNOSIS — F331 Major depressive disorder, recurrent, moderate: Secondary | ICD-10-CM | POA: Diagnosis not present

## 2021-07-30 DIAGNOSIS — F411 Generalized anxiety disorder: Secondary | ICD-10-CM | POA: Diagnosis not present

## 2021-07-30 DIAGNOSIS — F1020 Alcohol dependence, uncomplicated: Secondary | ICD-10-CM | POA: Diagnosis not present

## 2021-09-13 ENCOUNTER — Ambulatory Visit: Payer: BC Managed Care – PPO | Admitting: Family Medicine

## 2021-09-27 ENCOUNTER — Telehealth: Payer: Self-pay | Admitting: Neurology

## 2021-09-27 NOTE — Telephone Encounter (Signed)
LVM for patient to call back to get an appt with Dr Linford Arnold for further RX refills below. AMUCK

## 2021-09-27 NOTE — Telephone Encounter (Signed)
Patient left vm stating she just finished a detox program and they had written her some prescriptions while she was in the program and she wanted Dr. Linford Arnold to take these RX's over.   Venlafaxine 37.5 mg daily and Olanzapine (?)  Patient has not been seen in over a year. Needs a follow up appt to discuss with Dr. Linford Arnold or can call prescribing MD to see if they can write these for her until she is seen here. Please call to schedule follow up.

## 2021-09-28 NOTE — Telephone Encounter (Signed)
Spoke with Patient & I have her scheduled for a Telephone call on Tuesday, 11/02/2021 @ 1pm. Is this okay? Patient stated she will be at her parents house in [place] and they have bad reception/no wi-fi to do a virtual and she would be happy to do an in person visit in a few weeks when she is back, she is just afraid she will run out of medication while she is at her parents house.

## 2021-09-28 NOTE — Telephone Encounter (Signed)
Definitely more than happy to take over the prescriptions but we definitely need to do a visit.  We can even do virtual that is perfectly fine.

## 2021-09-28 NOTE — Telephone Encounter (Signed)
Telephone is perfect that way we can get her squared away and then I can always see her in person in a few weeks to make sure that the medications are working.  Thank you so much.

## 2021-10-02 ENCOUNTER — Telehealth (INDEPENDENT_AMBULATORY_CARE_PROVIDER_SITE_OTHER): Payer: BC Managed Care – PPO | Admitting: Family Medicine

## 2021-10-02 ENCOUNTER — Encounter: Payer: Self-pay | Admitting: Family Medicine

## 2021-10-02 VITALS — BP 97/65 | Ht 63.0 in | Wt 152.0 lb

## 2021-10-02 DIAGNOSIS — F332 Major depressive disorder, recurrent severe without psychotic features: Secondary | ICD-10-CM | POA: Diagnosis not present

## 2021-10-02 DIAGNOSIS — E559 Vitamin D deficiency, unspecified: Secondary | ICD-10-CM | POA: Insufficient documentation

## 2021-10-02 DIAGNOSIS — F10188 Alcohol abuse with other alcohol-induced disorder: Secondary | ICD-10-CM

## 2021-10-02 MED ORDER — VENLAFAXINE HCL 37.5 MG PO TABS: 37.5000 mg | ORAL_TABLET | Freq: Every morning | ORAL | 0 refills | Status: DC

## 2021-10-02 MED ORDER — OLANZAPINE 5 MG PO TABS
5.0000 mg | ORAL_TABLET | Freq: Every day | ORAL | 0 refills | Status: DC
Start: 2021-10-02 — End: 2022-01-02

## 2021-10-02 NOTE — Assessment & Plan Note (Signed)
Doing well so far on venlafaxine and olanzapine.  She is been on this combination for about 3 weeks.  I will see her back in about a month and we can make sure that she is doing well and titrate her dose at that point if needed.

## 2021-10-02 NOTE — Assessment & Plan Note (Signed)
Planning on getting it at the [facility] as soon as she returns to town for an outpatient maintenance program.

## 2021-10-02 NOTE — Assessment & Plan Note (Signed)
Starting over-the-counter 25 mcg vitamin D3.  Plan to recheck level in 2 to 3 months.

## 2021-10-02 NOTE — Progress Notes (Signed)
Virtual Visit via Telephone Note  I connected with Martha Richardson on 10/02/21 at  1:00 PM EDT by telephone and verified that I am speaking with the correct person using two identifiers.   I discussed the limitations, risks, security and privacy concerns of performing an evaluation and management service by telephone and the availability of in person appointments. I also discussed with the patient that there may be a patient responsible charge related to this service. The patient expressed understanding and agreed to proceed.   Patient location: in the car Provider loccation: In office   Subjective:    CC:  No chief complaint on file.   HPI:  Martha Richardson to detox in [place] 7/26-09/25/2021. She is out of town staying with her parents house.  She feels like overall she is actually doing really well.  She feels better than she has in a long time.  Was told to start vitamin D as she was deficient.   Her brother took his life in April and that sent her in tailspin.  He started drinking heavily again.  Plans on starting treatment at the [facility] for Alcohol maintenance program soon as she gets back in town in about 2 to 3 weeks..   Past medical history, Surgical history, Family history not pertinant except as noted below, Social history, Allergies, and medications have been entered into the medical record, reviewed, and corrections made.   Review of Systems: No fevers, chills, night sweats, weight loss, chest pain, or shortness of breath.   Objective:    General: Speaking clearly in complete sentences without any shortness of breath.  Alert and oriented x3.  Normal judgment. No apparent acute distress.    Impression and Recommendations:    Problem List Items Addressed This Visit       Nervous and Auditory   Alcohol abuse with alcohol-induced mental disorder [facility])    Planning on getting it at the [facility] as soon as she returns to town for an outpatient maintenance program.         Other   Vitamin D deficiency    Starting over-the-counter 25 mcg vitamin D3.  Plan to recheck level in 2 to 3 months.      Severe episode of recurrent major depressive disorder, without psychotic features (HCC) - Primary    Doing well so far on venlafaxine and olanzapine.  She is been on this combination for about 3 weeks.  I will see her back in about a month and we can make sure that she is doing well and titrate her dose at that point if needed.      Relevant Medications   venlafaxine (EFFEXOR) 37.5 MG tablet    Meds ordered this encounter  Medications   OLANZapine (ZYPREXA) 5 MG tablet    Sig: Take 1 tablet (5 mg total) by mouth at bedtime.    Dispense:  90 tablet    Refill:  0   venlafaxine (EFFEXOR) 37.5 MG tablet    Sig: Take 1 tablet (37.5 mg total) by mouth every morning.    Dispense:  90 tablet    Refill:  0    Meds ordered this encounter  Medications   OLANZapine (ZYPREXA) 5 MG tablet    Sig: Take 1 tablet (5 mg total) by mouth at bedtime.    Dispense:  90 tablet    Refill:  0   venlafaxine (EFFEXOR) 37.5 MG tablet    Sig: Take 1 tablet (37.5 mg total) by  mouth every morning.    Dispense:  90 tablet    Refill:  0     I discussed the assessment and treatment plan with the patient. The patient was provided an opportunity to ask questions and all were answered. The patient agreed with the plan and demonstrated an understanding of the instructions.   The patient was advised to call back or seek an in-person evaluation if the symptoms worsen or if the condition fails to improve as anticipated.  I provided 21 minutes of non-face-to-face time during this encounter.   Nani Gasser, MD

## 2021-11-08 DIAGNOSIS — Z1231 Encounter for screening mammogram for malignant neoplasm of breast: Secondary | ICD-10-CM | POA: Diagnosis not present

## 2021-11-08 DIAGNOSIS — Z13820 Encounter for screening for osteoporosis: Secondary | ICD-10-CM | POA: Diagnosis not present

## 2021-11-08 LAB — HM DEXA SCAN

## 2021-11-10 LAB — HM MAMMOGRAPHY

## 2021-11-12 DIAGNOSIS — Z01419 Encounter for gynecological examination (general) (routine) without abnormal findings: Secondary | ICD-10-CM | POA: Diagnosis not present

## 2021-11-12 DIAGNOSIS — Z6829 Body mass index (BMI) 29.0-29.9, adult: Secondary | ICD-10-CM | POA: Diagnosis not present

## 2021-11-15 ENCOUNTER — Ambulatory Visit: Payer: BC Managed Care – PPO | Admitting: Family Medicine

## 2021-11-15 ENCOUNTER — Encounter: Payer: Self-pay | Admitting: Family Medicine

## 2021-11-15 VITALS — BP 104/57 | HR 81 | Ht 63.0 in | Wt 158.0 lb

## 2021-11-15 DIAGNOSIS — M85852 Other specified disorders of bone density and structure, left thigh: Secondary | ICD-10-CM

## 2021-11-15 DIAGNOSIS — F10188 Alcohol abuse with other alcohol-induced disorder: Secondary | ICD-10-CM

## 2021-11-15 DIAGNOSIS — M8580 Other specified disorders of bone density and structure, unspecified site: Secondary | ICD-10-CM | POA: Insufficient documentation

## 2021-11-15 DIAGNOSIS — Z23 Encounter for immunization: Secondary | ICD-10-CM

## 2021-11-15 DIAGNOSIS — L609 Nail disorder, unspecified: Secondary | ICD-10-CM

## 2021-11-15 DIAGNOSIS — F411 Generalized anxiety disorder: Secondary | ICD-10-CM

## 2021-11-15 DIAGNOSIS — F332 Major depressive disorder, recurrent severe without psychotic features: Secondary | ICD-10-CM

## 2021-11-15 MED ORDER — EFINACONAZOLE 10 % EX SOLN: 1.0000 | Freq: Every day | CUTANEOUS | 3 refills | Status: DC

## 2021-11-15 MED ORDER — VENLAFAXINE HCL 37.5 MG PO TABS
37.5000 mg | ORAL_TABLET | Freq: Every morning | ORAL | 1 refills | Status: DC
Start: 2021-11-15 — End: 2022-06-25

## 2021-11-15 NOTE — Assessment & Plan Note (Signed)
He is currently in remission.  She is connected into a program at the [facility] and doing some intensive grief therapy.

## 2021-11-15 NOTE — Assessment & Plan Note (Signed)
T score -1.9.  Followed by GYN.

## 2021-11-15 NOTE — Assessment & Plan Note (Signed)
Fill her Effexor.  He is also doing well with the olanzapine she feels like the combination has been helpful for her depression and anxiety.

## 2021-11-15 NOTE — Progress Notes (Signed)
Established Patient Office Visit  Subjective   Patient ID: Martha Richardson, female    DOB: 1965-09-25  Age: 56 y.o. MRN: [id number]  Chief Complaint  Patient presents with   Follow-up         HPI She is currently undergoing grief recovery at the [facility] and is involved in some therapy sessions.  Completed a detox program in [place] from July 26 to August 8.  She is doing a little better.  She would really like to return to work on Saturday.  She does feel like she is in a place where she can return to work full duty with no restrictions.  She also we will get her toenails today.  She does tend to polish them over the summer and when she took her polish off she noticed some white spots on the tops of her nails.  [facility] did a pelvic but not a Pap smear and she said that they said she was up-to-date.  They did do a bone density test and she brought in a copy of the results.  Her T score was -1.9 at the femoral neck.  Diagnosed with osteopenia.  They made some treatment recommendations for her.     ROS    Objective:     BP (!) 104/57   Pulse 81   Ht 5\' 3"  (1.6 m)   Wt 158 lb (71.7 kg)   SpO2 97%   BMI 27.99 kg/m    Physical Exam   No results found for any visits on 11/15/21.    The 10-year ASCVD risk score (Arnett DK, et al., 2019) is: 3%    Assessment & Plan:   Problem List Items Addressed This Visit       Nervous and Auditory   Alcohol abuse with alcohol-induced mental disorder [facility])    He is currently in remission.  She is connected into a program at the [facility] and doing some intensive grief therapy.        Musculoskeletal and Integument   Osteopenia    T score -1.9.  Followed by GYN.        Other   Severe episode of recurrent major depressive disorder, without psychotic features ([place])    Fill her Effexor.  He is also doing well with the olanzapine she feels like the combination has been  helpful for her depression and anxiety.      Relevant Medications   ALPRAZolam (XANAX) 0.5 MG tablet   venlafaxine (EFFEXOR) 37.5 MG tablet   GAD (generalized anxiety disorder)    Continue current regimen.  Follow-up in 6 months.      Relevant Medications   ALPRAZolam (XANAX) 0.5 MG tablet   venlafaxine (EFFEXOR) 37.5 MG tablet   Other Visit Diagnoses     Nail abnormalities    -  Primary   Relevant Medications   Efinaconazole 10 % SOLN   Need for immunization against influenza       Relevant Orders   Flu Vaccine QUAD 70mo+IM (Fluarix, Fluzone & Alfiuria Quad PF) (Completed)      Nail abnormality-we discussed that it looks like it is more of a topical fungus.  We will treat with Jublia.  Discussed how to apply the medication.  If not covered by insurance then we could see if we can get Penlac.  Flu Vaccine given today.  Return in about 6 months (around 05/16/2022).    Barnetta Chapel  Madilyn Fireman, MD

## 2021-11-15 NOTE — Assessment & Plan Note (Signed)
Continue current regimen.    Follow-up in 6 months.

## 2021-11-29 DIAGNOSIS — F419 Anxiety disorder, unspecified: Secondary | ICD-10-CM | POA: Diagnosis not present

## 2021-11-29 DIAGNOSIS — Z79890 Hormone replacement therapy (postmenopausal): Secondary | ICD-10-CM | POA: Diagnosis not present

## 2021-11-29 DIAGNOSIS — L739 Follicular disorder, unspecified: Secondary | ICD-10-CM | POA: Diagnosis not present

## 2022-01-02 ENCOUNTER — Telehealth: Payer: Self-pay | Admitting: *Deleted

## 2022-01-02 ENCOUNTER — Other Ambulatory Visit: Payer: Self-pay | Admitting: Family Medicine

## 2022-01-02 NOTE — Telephone Encounter (Signed)
Pt called stating that she needed her Medical records from when she was seen in August. Pt advised that she would need to come in an sign a release and depending on the extent of what she needed it could take up to 30 days. Pt voiced understanding and agreed. She said that she would come by tomorrow to sign release.

## 2022-01-17 ENCOUNTER — Telehealth: Payer: Self-pay | Admitting: *Deleted

## 2022-03-05 ENCOUNTER — Other Ambulatory Visit: Payer: Self-pay

## 2022-03-05 DIAGNOSIS — F332 Major depressive disorder, recurrent severe without psychotic features: Secondary | ICD-10-CM

## 2022-03-05 MED ORDER — OLANZAPINE 5 MG PO TABS: ORAL_TABLET | ORAL | 0 refills | Status: DC

## 2022-05-08 ENCOUNTER — Encounter: Payer: Self-pay | Admitting: Family Medicine

## 2022-06-14 DIAGNOSIS — N898 Other specified noninflammatory disorders of vagina: Secondary | ICD-10-CM | POA: Diagnosis not present

## 2022-06-14 DIAGNOSIS — R319 Hematuria, unspecified: Secondary | ICD-10-CM | POA: Diagnosis not present

## 2022-06-25 ENCOUNTER — Ambulatory Visit: Payer: BC Managed Care – PPO | Admitting: Family Medicine

## 2022-06-25 ENCOUNTER — Encounter: Payer: Self-pay | Admitting: Family Medicine

## 2022-06-25 VITALS — BP 123/84 | HR 83 | Ht 63.0 in | Wt 160.0 lb

## 2022-06-25 DIAGNOSIS — R09A2 Foreign body sensation, throat: Secondary | ICD-10-CM | POA: Diagnosis not present

## 2022-06-25 DIAGNOSIS — R143 Flatulence: Secondary | ICD-10-CM

## 2022-06-25 DIAGNOSIS — Z800 Family history of malignant neoplasm of digestive organs: Secondary | ICD-10-CM

## 2022-06-25 DIAGNOSIS — R142 Eructation: Secondary | ICD-10-CM | POA: Diagnosis not present

## 2022-06-25 MED ORDER — PANTOPRAZOLE SODIUM 40 MG PO TBEC: 40.0000 mg | DELAYED_RELEASE_TABLET | Freq: Every day | ORAL | 3 refills | Status: DC

## 2022-06-25 NOTE — Patient Instructions (Signed)
Start Align probiotic Until see GI

## 2022-06-25 NOTE — Progress Notes (Signed)
Acute Office Visit  Subjective:     Patient ID: Martha Richardson, female    DOB: August 01, 1965, 57 y.o.   MRN: [id number]  Chief Complaint  Patient presents with   Globus Sensation    Pt reports that this has been bothering her for a few months. She also c/o excessive loud belching, flatulence, and that it hurts to swallow and takes several minutes for the feeling to go away. No recent changes to her diet      HPI Patient is in today for globus sensation in the anterior midline lwoer throat area.  No prior hx of stricture or GERD. She denies heart burn. Typically notices sensation with eating. Chews her food well.   Pt reports that this has been bothering her for a few months. She also c/o excessive loud belching, flatulence, and that it hurts to swallow and takes several minutes for the feeling to go away. No recent changes to her diet      Also feel down step 6 weeks ago and injured both knees and broke her toe. Didn't seek medical care but now has a lump on her left knee near edge of the patella.   ROS      Objective:    BP 123/84   Pulse 83   Ht 5\' 3"  (1.6 m)   Wt 160 lb (72.6 kg)   SpO2 99%   BMI 28.34 kg/m    Physical Exam Constitutional:      Appearance: She is well-developed.  HENT:     Head: Normocephalic and atraumatic.     Right Ear: External ear normal.     Left Ear: External ear normal.     Nose: Nose normal.     Mouth/Throat:     Pharynx: Oropharynx is clear.  Eyes:     Conjunctiva/sclera: Conjunctivae normal.     Pupils: Pupils are equal, round, and reactive to light.  Neck:     Thyroid: No thyromegaly.  Cardiovascular:     Rate and Rhythm: Normal rate and regular rhythm.     Heart sounds: Normal heart sounds.  Pulmonary:     Effort: Pulmonary effort is normal.     Breath sounds: Normal breath sounds. No wheezing.  Musculoskeletal:     Cervical back: Neck supple.  Lymphadenopathy:     Cervical: No cervical adenopathy.  Skin:    General: Skin  is warm and dry.  Neurological:     Mental Status: She is alert and oriented to person, place, and time.     Results for orders placed or performed in visit on 06/25/22  HM MAMMOGRAPHY  Result Value Ref Range   HM Mammogram 0-4 Bi-Rad 0-4 Bi-Rad, Self Reported Normal  HM PAP SMEAR  Result Value Ref Range   HM Pap smear negativ e         Assessment & Plan:   Problem List Items Addressed This Visit   None Visit Diagnoses     Globus sensation    -  Primary   Relevant Orders   Ambulatory referral to Gastroenterology   Family history of colon cancer       Relevant Orders   Ambulatory referral to Gastroenterology   Flatulence       Belching           For globus sensation I really am concerned about potential esophageal stricture versus esophagitis.  Will refer to GI for further workup.  She is also overdue for colonoscopy especially with  family history of colon cancer in her father.  So hopefully they can consider doing an upper and a lower scope at the same time if warranted.  Flatulence/belching-recommend a trial of a probiotic such as align to see if this helps with the excess gas production.  GERD-recommended trial of a PPI while she is waiting to get in with GI to see if this is helpful and see if it also reduces some of the symptoms she is experiencing in her throat and lower neck.  Meds ordered this encounter  Medications   pantoprazole (PROTONIX) 40 MG tablet    Sig: Take 1 tablet (40 mg total) by mouth daily.    Dispense:  30 tablet    Refill:  3    No follow-ups on file.  Nani Gasser, MD

## 2022-06-26 DIAGNOSIS — R102 Pelvic and perineal pain: Secondary | ICD-10-CM | POA: Diagnosis not present

## 2022-06-26 DIAGNOSIS — L739 Follicular disorder, unspecified: Secondary | ICD-10-CM | POA: Diagnosis not present

## 2022-07-31 ENCOUNTER — Telehealth: Payer: Self-pay | Admitting: Family Medicine

## 2022-07-31 NOTE — Telephone Encounter (Signed)
Patient called stating that the earliest she could be scheduled for her colonoscopy and endoscopy was 7/17.  Patient was told by Dr. Linford Arnold to call the office if she had any trouble getting scheduled. Patient claims she can not wait until 7/17 as her symptoms are getting worse.

## 2022-08-08 NOTE — Telephone Encounter (Signed)
Contacted [facility], patient has first available appointment on 09/04/2022 at 9:30 am with Dr. Leone Payor. Patient has been added to wait list.

## 2022-08-08 NOTE — Telephone Encounter (Signed)
Pt aware that she has the first available appointment on 09/04/2022 at 9:30am with Dr. Leone Payor and that she has also been added to wait list.

## 2022-08-12 ENCOUNTER — Encounter: Payer: Self-pay | Admitting: Physician Assistant

## 2022-08-12 ENCOUNTER — Ambulatory Visit: Payer: BC Managed Care – PPO | Admitting: Physician Assistant

## 2022-08-12 VITALS — BP 140/95 | HR 99 | Ht 63.0 in | Wt 161.0 lb

## 2022-08-12 DIAGNOSIS — Z789 Other specified health status: Secondary | ICD-10-CM

## 2022-08-12 DIAGNOSIS — R030 Elevated blood-pressure reading, without diagnosis of hypertension: Secondary | ICD-10-CM

## 2022-08-12 DIAGNOSIS — F1011 Alcohol abuse, in remission: Secondary | ICD-10-CM

## 2022-08-12 DIAGNOSIS — E785 Hyperlipidemia, unspecified: Secondary | ICD-10-CM | POA: Diagnosis not present

## 2022-08-12 DIAGNOSIS — R5383 Other fatigue: Secondary | ICD-10-CM | POA: Diagnosis not present

## 2022-08-12 DIAGNOSIS — F419 Anxiety disorder, unspecified: Secondary | ICD-10-CM

## 2022-08-12 DIAGNOSIS — E559 Vitamin D deficiency, unspecified: Secondary | ICD-10-CM | POA: Diagnosis not present

## 2022-08-12 DIAGNOSIS — E538 Deficiency of other specified B group vitamins: Secondary | ICD-10-CM | POA: Diagnosis not present

## 2022-08-12 DIAGNOSIS — Z13220 Encounter for screening for lipoid disorders: Secondary | ICD-10-CM

## 2022-08-12 DIAGNOSIS — Z720 Tobacco use: Secondary | ICD-10-CM

## 2022-08-12 DIAGNOSIS — F332 Major depressive disorder, recurrent severe without psychotic features: Secondary | ICD-10-CM | POA: Diagnosis not present

## 2022-08-12 MED ORDER — ALPRAZOLAM 0.5 MG PO TABS
0.5000 mg | ORAL_TABLET | Freq: Two times a day (BID) | ORAL | 0 refills | Status: DC | PRN
Start: 2022-08-12 — End: 2023-10-29

## 2022-08-12 MED ORDER — VORTIOXETINE HBR 5 MG PO TABS
5.0000 mg | ORAL_TABLET | Freq: Every day | ORAL | 1 refills | Status: DC
Start: 2022-08-12 — End: 2022-08-15

## 2022-08-12 NOTE — Progress Notes (Unsigned)
   Acute Office Visit  Subjective:     Patient ID: Martha Richardson, female    DOB: 28-Mar-1965, 57 y.o.   MRN: [id number]  No chief complaint on file.   HPI Patient is in today for  Zoloft-couldn't sleep Ambien-do weird things Lexapro-didn't do anyth  So tired.   Alcohol some but not daily   ROS      Objective:    There were no vitals taken for this visit. {Vitals History (Optional):23777}  Physical Exam  No results found for any visits on 08/12/22.      Assessment & Plan:   Problem List Items Addressed This Visit   None   No orders of the defined types were placed in this encounter.   No follow-ups on file.  Tandy Gaw, PA-C

## 2022-08-13 ENCOUNTER — Encounter: Payer: Self-pay | Admitting: Physician Assistant

## 2022-08-13 DIAGNOSIS — R5383 Other fatigue: Secondary | ICD-10-CM | POA: Insufficient documentation

## 2022-08-13 DIAGNOSIS — F1011 Alcohol abuse, in remission: Secondary | ICD-10-CM | POA: Insufficient documentation

## 2022-08-13 DIAGNOSIS — F419 Anxiety disorder, unspecified: Secondary | ICD-10-CM | POA: Insufficient documentation

## 2022-08-13 DIAGNOSIS — Z789 Other specified health status: Secondary | ICD-10-CM | POA: Insufficient documentation

## 2022-08-13 DIAGNOSIS — D751 Secondary polycythemia: Secondary | ICD-10-CM | POA: Insufficient documentation

## 2022-08-13 LAB — CBC WITH DIFFERENTIAL/PLATELET
Absolute Monocytes: 969 cells/uL — ABNORMAL HIGH (ref 200–950)
Basophils Absolute: 94 cells/uL (ref 0–200)
Basophils Relative: 1.1 %
Eosinophils Absolute: 170 cells/uL (ref 15–500)
Eosinophils Relative: 2 %
HCT: 45.4 % — ABNORMAL HIGH (ref 35.0–45.0)
Hemoglobin: 15.7 g/dL — ABNORMAL HIGH (ref 11.7–15.5)
Lymphs Abs: 2346 cells/uL (ref 850–3900)
MCH: 35.8 pg — ABNORMAL HIGH (ref 27.0–33.0)
MCHC: 34.6 g/dL (ref 32.0–36.0)
MCV: 103.4 fL — ABNORMAL HIGH (ref 80.0–100.0)
MPV: 9.4 fL (ref 7.5–12.5)
Monocytes Relative: 11.4 %
Neutro Abs: 4922 cells/uL (ref 1500–7800)
Neutrophils Relative %: 57.9 %
Platelets: 223 10*3/uL (ref 140–400)
RBC: 4.39 10*6/uL (ref 3.80–5.10)
RDW: 13.2 % (ref 11.0–15.0)
Total Lymphocyte: 27.6 %
WBC: 8.5 10*3/uL (ref 3.8–10.8)

## 2022-08-13 LAB — COMPLETE METABOLIC PANEL WITH GFR
AG Ratio: 2 (calc) (ref 1.0–2.5)
ALT: 269 U/L — ABNORMAL HIGH (ref 6–29)
AST: 286 U/L — ABNORMAL HIGH (ref 10–35)
Albumin: 5.3 g/dL — ABNORMAL HIGH (ref 3.6–5.1)
Alkaline phosphatase (APISO): 83 U/L (ref 37–153)
BUN: 12 mg/dL (ref 7–25)
CO2: 26 mmol/L (ref 20–32)
Calcium: 10.7 mg/dL — ABNORMAL HIGH (ref 8.6–10.4)
Chloride: 97 mmol/L — ABNORMAL LOW (ref 98–110)
Creat: 0.73 mg/dL (ref 0.50–1.03)
Globulin: 2.6 g/dL (calc) (ref 1.9–3.7)
Glucose, Bld: 76 mg/dL (ref 65–99)
Potassium: 4.9 mmol/L (ref 3.5–5.3)
Sodium: 138 mmol/L (ref 135–146)
Total Bilirubin: 0.9 mg/dL (ref 0.2–1.2)
Total Protein: 7.9 g/dL (ref 6.1–8.1)
eGFR: 96 mL/min/{1.73_m2} (ref >=60)

## 2022-08-13 LAB — LIPID PANEL W/REFLEX DIRECT LDL
Cholesterol: 313 mg/dL — ABNORMAL HIGH (ref <200)
HDL: 123 mg/dL (ref >=50)
LDL Cholesterol (Calc): 160 mg/dL (calc) — ABNORMAL HIGH
Non-HDL Cholesterol (Calc): 190 mg/dL (calc) — ABNORMAL HIGH (ref <130)
Total CHOL/HDL Ratio: 2.5 (calc) (ref <5.0)
Triglycerides: 155 mg/dL — ABNORMAL HIGH (ref <150)

## 2022-08-13 LAB — IRON,TIBC AND FERRITIN PANEL
%SAT: 36 % (calc) (ref 16–45)
Ferritin: 434 ng/mL — ABNORMAL HIGH (ref 16–232)
Iron: 144 ug/dL (ref 45–160)
TIBC: 399 mcg/dL (calc) (ref 250–450)

## 2022-08-13 LAB — THYROID PEROXIDASE ANTIBODY: Thyroperoxidase Ab SerPl-aCnc: <1 IU/mL (ref <9)

## 2022-08-13 LAB — B12 AND FOLATE PANEL
Folate: 7.8 ng/mL
Vitamin B-12: 377 pg/mL (ref 200–1100)

## 2022-08-13 LAB — VITAMIN D 25 HYDROXY (VIT D DEFICIENCY, FRACTURES): Vit D, 25-Hydroxy: 10 ng/mL — ABNORMAL LOW (ref 30–100)

## 2022-08-13 LAB — TSH: TSH: 3.14 mIU/L (ref 0.40–4.50)

## 2022-08-13 NOTE — Patient Instructions (Signed)
Suicidal Feelings: How to Help Yourself Suicide is when you end your own life. Suicidal ideation includes expressing thoughts about, or a preoccupation with, ending your own life. There are many things you can do to help yourself feel better when struggling with these feelings. Many services and people are available to support you and others who struggle with similar feelings. If you ever feel like you may hurt yourself or others, or have thoughts about taking your own life, get help right away. To get help: Go to your nearest emergency department. Call your local emergency services (911 in the [place] Call the [employer]'s health and human services helpline (211 in the [place] Call or text a suicide hotline to speak with a trained counselor. The following suicide hotlines are available in the [place]: 1-800-273-TALK (1-800-273-8255 or 988 in the [place] 1-800-SUICIDE (1-800-784-2433). Text 741741. This is the Crisis Text Line in the [place] 1-888-628-9454. This is a hotline for Spanish speakers. 1-800-799-4889. This is a hotline for TTY users. 1-866-4-U-TREVOR (1-866-488-7386). This is a hotline for lesbian, gay, bisexual, transgender, or questioning youth. For a list of hotlines in [place], visit suicide.org/hotlines/international/[place]-suicide-hotlines.html Contact a crisis center or a local suicide prevention center. To find a crisis center or suicide prevention center: Call your local hospital, clinic, community service organization, mental health center, social service provider, or health department. Ask for help with connecting to a crisis center. For a list of crisis centers in the [place], visit: suicidepreventionlifeline.org For a list of crisis centers in [place], visit: suicideprevention.ca How to help yourself feel better  Promise yourself that you will not do anything bad or extreme when you have suicidal feelings. Remember the times you have felt hopeful. Many people have  gotten through suicidal thoughts and feelings, and you can too. If you have had these feelings before, remind yourself that you can get through them again. Let family, friends, teachers, or counselors know how you are feeling. Do not separate yourself from those who care about you and want to help you. Talk with someone every day, even if you do not feel like talking to anyone or being with other people. Face-to-face conversation is best to help them understand your feelings. Contact a mental health care provider and work with this person regularly. Make a safety plan that you can follow during a crisis. Include phone numbers of suicide prevention hotlines, mental health professionals, and trusted friends and family members you can call during an emergency. Save these numbers on your phone. If you are thinking of taking a lot of medicine, give your medicine to someone who can give it to you as prescribed. If you are on antidepressants and are concerned you will overdose, tell your health care provider so that he or she can give you safer medicines. Try to stick to your routines and follow a schedule every day. Make self-care a priority. Make a list of realistic goals, and cross them off when you achieve them. Accomplishments can give you a sense of worth. Wait until you are feeling better before doing things that you find difficult or unpleasant. Do things that you have always enjoyed to take your mind off your feelings. Try reading a book, or listening to or playing music. Spending time outside, in nature, may help you feel better. Follow these instructions at home:  Visit your primary health care provider every year for a physical and a mental health checkup. Take over-the-counter and prescription medicines only as told by your health care   provider. Ask your health care provider about the possible side effects of any medicines you are taking. Ask your health care provider about whether  suicidal ideation is a possible side effect of any of your medicines. Learn about suicidal ideation and what increases the risk for the development of suicidal thoughts. Eat a well-balanced diet, and eat regular meals. Get plenty of rest. Exercise if you are able. Just 30 minutes of exercise each day can help you feel better. Keep your living space well lit. Do not use alcohol or drugs. Remove these substances from your home. General recommendations Remove weapons, poisons, knives, and other deadly items from your home. Work with a mental health care provider as needed. When you are feeling well, write yourself a letter with tips and support that you can read when you are not feeling well. Remember that life's difficulties can be sorted out with help. Conditions can be treated, and you can learn behaviors and ways of thinking that will help you. Work with your health care provider or counselor to learn ways of coping with your thoughts and feelings. Where to find more information National Suicide Prevention Lifeline: www.suicidepreventionlifeline.org Hopeline: www.hopeline.com [employer] for Suicide Prevention: https://www.ayers.com/ [employer] (for lesbian, gay, bisexual, transgender, or questioning youth): www.thetrevorproject.[employer] of Mental Health: https://ramirez-williams.com/ Suicide Prevention Resources: FarmerBuys.com.au Contact a health care provider if: You feel as though you are a burden to others. You feel agitated, angry, vengeful, or have extreme mood swings. You have withdrawn from family and friends. You are frequently using drugs or alcohol. Get help right away if: You are talking about suicide or wishing to die. You start making plans for how to commit suicide. You feel that you have no reason to live. You start making plans for putting your affairs in order, saying goodbye, or giving your possessions  away. You feel guilt, shame, or unbearable pain, and it seems like there is no way out. You are engaging in risky behaviors that could lead to death. If you have any of these thoughts or symptoms, get help right away: Go to your nearest emergency department or crisis center. Call emergency services (911 in the [place] Call or text a suicide crisis helpline. Summary Suicide is when you take your own life. Suicidal feelings are thoughts about ending your own life. Promise yourself that you will not do anything bad or extreme when you have suicidal feelings. Let family, friends, teachers, or counselors know how you are feeling. Get help right away if you start making plans for how to commit suicide. This information is not intended to replace advice given to you by your health care provider. Make sure you discuss any questions you have with your health care provider. Document Revised: 08/31/2020 Document Reviewed: 06/15/2020 Elsevier Patient Education  2024 [employer].

## 2022-08-13 NOTE — Progress Notes (Signed)
Martha Richardson,   The biggest finding is your liver enzymes are worse. I know you saw GI and discussed this was likely from alcohol. How much alcohol are you drinking weekly? Right now you need to stop all alcohol consumption. Do you have any follow ups with GI?   Glucose and kidney look good.  B12 on the low side. Ok to start b12 daily.  Folate looks good.  Vitamin D VERY low. Are you taking any vitamin D? If so how much?  Thyroid normal range but up some from 2 years ago. Continue to monitor and recheck in 3-6 months.  Hemoglobin elevated but likely due to smoking. We really need you to cut back on smoking.   LDL worsened. HDL looks great. Hold on cholesterol discussion due to elevated liver enzymes.   I will route to PCP for further review.

## 2022-08-14 ENCOUNTER — Telehealth: Payer: Self-pay | Admitting: Family Medicine

## 2022-08-14 NOTE — Telephone Encounter (Signed)
Pt called. Patient states she is unable to afford the Trintellix, and can you prescribe something else?

## 2022-08-15 ENCOUNTER — Telehealth: Payer: Self-pay | Admitting: Family Medicine

## 2022-08-15 DIAGNOSIS — F332 Major depressive disorder, recurrent severe without psychotic features: Secondary | ICD-10-CM

## 2022-08-15 MED ORDER — VILAZODONE HCL 10 MG PO TABS
ORAL_TABLET | ORAL | 0 refills | Status: DC
Start: 2022-08-15 — End: 2023-10-29

## 2022-08-15 NOTE — Addendum Note (Signed)
Addended by: Nani Gasser D on: 08/15/2022 04:56 PM   Modules accepted: Orders

## 2022-08-15 NOTE — Telephone Encounter (Signed)
New rx sent for Viibryd.  Encouraged her to consider going for therapy as well.  We have to get therapist here in our office if this is convenient.  Meds ordered this encounter  Medications   Vilazodone HCl (VIIBRYD) 10 MG TABS    Sig: Take 1 tablet (10 mg total) by mouth daily for 7 days, THEN 2 tablets (20 mg total) daily for 23 days.    Dispense:  53 tablet    Refill:  0

## 2022-08-15 NOTE — Telephone Encounter (Signed)
Patient called she stated that Trintellix 5mg  cost 311.00 she is asking to get something else [facility] in [place]  Phone number [phone]

## 2022-08-19 ENCOUNTER — Ambulatory Visit: Payer: BC Managed Care – PPO | Admitting: Physician Assistant

## 2022-08-19 ENCOUNTER — Encounter: Payer: Self-pay | Admitting: Physician Assistant

## 2022-08-19 VITALS — BP 140/81 | HR 95 | Ht 63.0 in | Wt 156.0 lb

## 2022-08-19 DIAGNOSIS — Z720 Tobacco use: Secondary | ICD-10-CM

## 2022-08-19 DIAGNOSIS — D751 Secondary polycythemia: Secondary | ICD-10-CM

## 2022-08-19 DIAGNOSIS — R413 Other amnesia: Secondary | ICD-10-CM

## 2022-08-19 DIAGNOSIS — F332 Major depressive disorder, recurrent severe without psychotic features: Secondary | ICD-10-CM | POA: Diagnosis not present

## 2022-08-19 DIAGNOSIS — E559 Vitamin D deficiency, unspecified: Secondary | ICD-10-CM

## 2022-08-19 DIAGNOSIS — F411 Generalized anxiety disorder: Secondary | ICD-10-CM

## 2022-08-19 DIAGNOSIS — F1011 Alcohol abuse, in remission: Secondary | ICD-10-CM

## 2022-08-19 DIAGNOSIS — E538 Deficiency of other specified B group vitamins: Secondary | ICD-10-CM

## 2022-08-19 DIAGNOSIS — R748 Abnormal levels of other serum enzymes: Secondary | ICD-10-CM

## 2022-08-19 MED ORDER — BUSPIRONE HCL 5 MG PO TABS
5.0000 mg | ORAL_TABLET | Freq: Three times a day (TID) | ORAL | 1 refills | Status: DC
Start: 2022-08-19 — End: 2023-10-29

## 2022-08-19 NOTE — Patient Instructions (Addendum)
Start buspar twice a day with option to take three times a day.  Start D3 2000 units a day.  Start B12 a day.  Start ASA 81mg  a day.  Continue with Viibryd.  Will order MRI. They will call to schedule.  Get labs before GI appt.  Try to cut smoking by at least 1/4.

## 2022-08-20 ENCOUNTER — Encounter: Payer: Self-pay | Admitting: Physician Assistant

## 2022-08-20 DIAGNOSIS — R748 Abnormal levels of other serum enzymes: Secondary | ICD-10-CM | POA: Insufficient documentation

## 2022-08-20 DIAGNOSIS — E538 Deficiency of other specified B group vitamins: Secondary | ICD-10-CM | POA: Insufficient documentation

## 2022-08-20 NOTE — Progress Notes (Signed)
Established Patient Office Visit  Subjective   Patient ID: Martha Richardson, female    DOB: Aug 11, 1965  Age: 57 y.o. MRN: [id number]  Chief Complaint  Patient presents with   Follow-up    Lab results    HPI Pt is a 57 yo female who presents to the clinic with her husband to go over labs drawn last week.   Husband is concerned with worsening anxiety almost to the point of not being able to function. She has not picked up Viibryd yet. Not able to focus. Easily forgets conversations. Trouble finishing tasks.   .. Active Ambulatory Problems    Diagnosis Date Noted   ALCOHOL ABUSE 06/13/2008   LUNG NODULE 07/10/2008   HIP PAIN, RIGHT 12/08/2007   BACK PAIN, LUMBAR 11/09/2009   DE QUERVAIN'S TENOSYNOVITIS 02/20/2009   COUGH, CHRONIC 07/08/2008   Tobacco abuse 06/08/2013   Alcohol abuse with alcohol-induced mental disorder (HCC) 05/17/2015   GAD (generalized anxiety disorder)    Severe episode of recurrent major depressive disorder, without psychotic features ([facility])    Benzodiazepine dependence, continuous (HCC)    Alcohol dependence (HCC) 05/17/2015   Right cervical radiculopathy 09/11/2015   Hyperlipidemia 06/03/2018   Family history of colon cancer in father 06/03/2018   Primary osteoarthritis of first carpometacarpal joint of one hand, right 08/10/2019   Primary osteoarthritis of right knee 08/10/2019   Folic acid deficiency 06/01/2020   Vitamin D deficiency 10/02/2021   Osteopenia 11/15/2021   Elevated blood pressure reading 08/12/2022   No energy 08/13/2022   History of alcohol abuse 08/13/2022   Anxiety 08/13/2022   Alcohol use 08/13/2022   Secondary polycythemia 08/13/2022   Elevated liver enzymes 08/20/2022   Low serum vitamin B12 08/20/2022   Resolved Ambulatory Problems    Diagnosis Date Noted   Rash and other nonspecific skin eruption 04/08/2008   Diarrhea 04/26/2010   Alcohol dependence with uncomplicated withdrawal (HCC)    Lateral epicondylitis, right elbow  09/08/2019   Past Medical History:  Diagnosis Date   Arthritis    Kidney stones      ROS See HPI.    Objective:     BP (!) 140/81 (BP Location: Left Arm, Patient Position: Sitting, Cuff Size: Normal)   Pulse 95   Ht 5\' 3"  (1.6 m)   Wt 156 lb (70.8 kg)   SpO2 98%   BMI 27.63 kg/m  BP Readings from Last 3 Encounters:  08/19/22 (!) 140/81  08/12/22 (!) 140/95  06/25/22 123/84   Wt Readings from Last 3 Encounters:  08/19/22 156 lb (70.8 kg)  08/12/22 161 lb (73 kg)  06/25/22 160 lb (72.6 kg)      Physical Exam Constitutional:      Appearance: Normal appearance.  HENT:     Head: Normocephalic.  Cardiovascular:     Rate and Rhythm: Normal rate.  Pulmonary:     Effort: Pulmonary effort is normal.     Breath sounds: Normal breath sounds.  Musculoskeletal:     Right lower leg: No edema.     Left lower leg: Edema present.  Neurological:     General: No focal deficit present.     Mental Status: She is alert and oriented to person, place, and time.  Psychiatric:     Comments: Flat affect        Last CBC Lab Results  Component Value Date   WBC 8.5 08/12/2022   HGB 15.7 (H) 08/12/2022   HCT 45.4 (H) 08/12/2022  MCV 103.4 (H) 08/12/2022   MCH 35.8 (H) 08/12/2022   RDW 13.2 08/12/2022   PLT 223 08/12/2022   Last metabolic panel Lab Results  Component Value Date   GLUCOSE 76 08/12/2022   NA 138 08/12/2022   K 4.9 08/12/2022   CL 97 (L) 08/12/2022   CO2 26 08/12/2022   BUN 12 08/12/2022   CREATININE 0.73 08/12/2022   EGFR 96 08/12/2022   CALCIUM 10.7 (H) 08/12/2022   PROT 7.9 08/12/2022   ALBUMIN 4.4 09/07/2020   BILITOT 0.9 08/12/2022   ALKPHOS 72 09/07/2020   AST 286 (H) 08/12/2022   ALT 269 (H) 08/12/2022   ANIONGAP 12 10/06/2018   Last lipids Lab Results  Component Value Date   CHOL 313 (H) 08/12/2022   HDL 123 08/12/2022   LDLCALC 160 (H) 08/12/2022   TRIG 155 (H) 08/12/2022   CHOLHDL 2.5 08/12/2022   Last hemoglobin A1c Lab  Results  Component Value Date   HGBA1C 5.7 (A) 05/26/2020   Last thyroid functions Lab Results  Component Value Date   TSH 3.14 08/12/2022   Last vitamin D Lab Results  Component Value Date   VD25OH 10 (L) 08/12/2022   Last vitamin B12 and Folate Lab Results  Component Value Date   VITAMINB12 377 08/12/2022   FOLATE 7.8 08/12/2022    .Marland Kitchen    08/19/2022    9:44 AM  Montreal Cognitive Assessment   Visuospatial/ Executive (0/5) 4  Naming (0/3) 3  Attention: Read list of digits (0/2) 2  Attention: Read list of letters (0/1) 1  Attention: Serial 7 subtraction starting at 100 (0/3) 2  Language: Repeat phrase (0/2) 2  Language : Fluency (0/1) 1  Abstraction (0/2) 2  Delayed Recall (0/5) 5  Orientation (0/6) 6  Total 28       Assessment & Plan:  Marland KitchenMarland KitchenKamarria was seen today for follow-up.  Diagnoses and all orders for this visit:  GAD (generalized anxiety disorder) -     busPIRone (BUSPAR) 5 MG tablet; Take 1 tablet (5 mg total) by mouth 3 (three) times daily.  Tobacco abuse  Severe episode of recurrent major depressive disorder, without psychotic features (HCC)  Secondary polycythemia -     CBC w/Diff/Platelet  Elevated liver enzymes -     Hepatic function panel  History of alcohol abuse  Vitamin D deficiency  Low serum vitamin B12  Memory changes -     MR Brain Wo Contrast; Future   Most concerning was elevated liver enzymes She has had work up before with GI and determined elevated liver enzymes likely due to alcohol use but patient and patient husband report a significant decrease in alcohol use Will recheck in 2 weeks, stop all alcohol use and tylenol use Follow up with GI on 17th of July Certainly could be due to acute viral illness  TSH up some but still in normal range and not likely causing these severe symptoms  Vitamin D really low-add vitamin D 2000 units daily with dairy for better absorption B12 low normal -start B12 daily  Elevated  hemoglobin/hematocrit/MCV  Likey due to smoking which has increased since anxiety increased Try to cut back by 1/4 recheck in 2-3 weeks Start ASA 81mg  daily  Memory changes ? 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

## 2022-08-28 DIAGNOSIS — R748 Abnormal levels of other serum enzymes: Secondary | ICD-10-CM | POA: Diagnosis not present

## 2022-08-29 LAB — CBC WITH DIFFERENTIAL/PLATELET
Absolute Monocytes: 1162 cells/uL — ABNORMAL HIGH (ref 200–950)
Basophils Absolute: 81 cells/uL (ref 0–200)
Basophils Relative: 1.1 %
Eosinophils Absolute: 141 cells/uL (ref 15–500)
Eosinophils Relative: 1.9 %
HCT: 38.8 % (ref 35.0–45.0)
Hemoglobin: 13.3 g/dL (ref 11.7–15.5)
Lymphs Abs: 1591 cells/uL (ref 850–3900)
MCH: 35.8 pg — ABNORMAL HIGH (ref 27.0–33.0)
MCHC: 34.3 g/dL (ref 32.0–36.0)
MCV: 104.3 fL — ABNORMAL HIGH (ref 80.0–100.0)
MPV: 10 fL (ref 7.5–12.5)
Monocytes Relative: 15.7 %
Neutro Abs: 4425 cells/uL (ref 1500–7800)
Neutrophils Relative %: 59.8 %
Platelets: 385 10*3/uL (ref 140–400)
RBC: 3.72 10*6/uL — ABNORMAL LOW (ref 3.80–5.10)
RDW: 12.3 % (ref 11.0–15.0)
Total Lymphocyte: 21.5 %
WBC: 7.4 10*3/uL (ref 3.8–10.8)

## 2022-08-29 LAB — HEPATIC FUNCTION PANEL
AG Ratio: 1.8 (calc) (ref 1.0–2.5)
ALT: 58 U/L — ABNORMAL HIGH (ref 6–29)
AST: 28 U/L (ref 10–35)
Albumin: 4.2 g/dL (ref 3.6–5.1)
Alkaline phosphatase (APISO): 58 U/L (ref 37–153)
Bilirubin, Direct: 0.1 mg/dL (ref 0.0–0.2)
Globulin: 2.4 g/dL (calc) (ref 1.9–3.7)
Indirect Bilirubin: 0.3 mg/dL (calc) (ref 0.2–1.2)
Total Bilirubin: 0.4 mg/dL (ref 0.2–1.2)
Total Protein: 6.6 g/dL (ref 6.1–8.1)

## 2022-08-29 NOTE — Progress Notes (Signed)
Martha Richardson,   Hemoglobin has decrease which is great news.  Liver enzymes have decreased A LOT. Which is great news.  Monocytes are elevated-really think you had a virus that caused a lot of this.   Labs are reassuring.

## 2022-09-04 ENCOUNTER — Ambulatory Visit (INDEPENDENT_AMBULATORY_CARE_PROVIDER_SITE_OTHER): Payer: BC Managed Care – PPO | Admitting: Internal Medicine

## 2022-09-04 ENCOUNTER — Encounter: Payer: Self-pay | Admitting: Internal Medicine

## 2022-09-04 VITALS — BP 110/72 | HR 76 | Ht 63.0 in | Wt 164.0 lb

## 2022-09-04 DIAGNOSIS — Z1211 Encounter for screening for malignant neoplasm of colon: Secondary | ICD-10-CM

## 2022-09-04 DIAGNOSIS — R09A2 Foreign body sensation, throat: Secondary | ICD-10-CM

## 2022-09-04 DIAGNOSIS — Z800 Family history of malignant neoplasm of digestive organs: Secondary | ICD-10-CM

## 2022-09-04 NOTE — Progress Notes (Signed)
Martha Richardson 56 y.o. 04-Sep-1965 [id number]  Assessment & Plan:   Encounter Diagnoses  Name Primary?   Globus sensation Yes   Colon cancer screening    Family history of colon cancer in father    She is reassured regarding the globus.  I think it is highly likely this is related to her anxiety issues right now.  She is comfortable with observation.  Should things persist we could pursue a barium swallow versus an EGD.  Globus patient information handout provided.  Screening colonoscopy will be scheduled.   The risks and benefits as well as alternatives of endoscopic procedure(s) have been discussed and reviewed. All questions answered. The patient agrees to proceed.  CC: Agapito Games, MD  Subjective:   Chief Complaint: Lump in throat screening colonoscopy  HPI 57 year old white woman previously known to Dr. Christella Hartigan, history of alcoholic hepatitis and liver disease, now abstinent, who has been struggling with anxiety and depression over the past several months.  She is under treatment for that and is improving.  She has seen primary care (notes reviewed) and is feeling better but suffers with globus sensation.  Comes and goes and is worrying her.  There is no heartburn or dysphagia or postnasal drip or other ENT symptoms.  It happens several times a week but not every day.  A trial of PPI therapy was not helpful.  She is overdue for a screening colonoscopy and is requesting 1 of those.  For the history of colon cancer in her father less than 60.  Last colonoscopy 2009 was negative for neoplasia.  GI review of systems is otherwise negative at this time.  No Active Allergies Current Meds  Medication Sig   ALPRAZolam (XANAX) 0.5 MG tablet Take 1 tablet (0.5 mg total) by mouth 2 (two) times daily as needed.   busPIRone (BUSPAR) 5 MG tablet Take 1 tablet (5 mg total) by mouth 3 (three) times daily.   Vilazodone HCl (VIIBRYD) 10 MG TABS Take 1 tablet (10 mg total) by mouth  daily for 7 days, THEN 2 tablets (20 mg total) daily for 23 days.   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   Review of Systems As per HPI otherwise negative  Objective:   Physical Exam @BP  110/72   Pulse 76   Ht 5\' 3"  (1.6 m)   Wt 164 lb (74.4 kg)   BMI 29.05 kg/m @  General:  Well-developed, well-nourished and in no acute distress Eyes:  anicteric. ENT:   Mouth and posterior  pharynx free of lesions.  Neck:   supple w/o thyromegaly or mass.  Lungs: Clear to auscultation bilaterally. Heart:   S1S2, no rubs, murmurs, gallops. Abdomen:  soft, non-tender, no hepatosplenomegaly, hernia, or mass and BS+.  Rectal: deferred Lymph:  no cervical or supraclavicular adenopathy.. Neuro:  A&O x 3.  Psych:  appropriate mood and  Affect.   Data Reviewed: See HPI

## 2022-09-04 NOTE — Patient Instructions (Signed)
You have been scheduled for a colonoscopy. Please follow written instructions given to you at your visit today.   Please pick up your prep supplies at the pharmacy within the next 1-3 days.  If you use inhalers (even only as needed), please bring them with you on the day of your procedure.  DO NOT TAKE 7 DAYS PRIOR TO TEST- Trulicity (dulaglutide) Ozempic, Wegovy (semaglutide) Mounjaro (tirzepatide) Bydureon Bcise (exanatide extended release)  DO NOT TAKE 1 DAY PRIOR TO YOUR TEST Rybelsus (semaglutide) Adlyxin (lixisenatide) Victoza (liraglutide) Byetta (exanatide) ___________________________________________________________________________   We have provided you with information on Globus sensation.    I appreciate the opportunity to care for you. Stan Head, MD, [facility]

## 2022-09-05 ENCOUNTER — Encounter: Payer: Self-pay | Admitting: Internal Medicine

## 2022-09-25 ENCOUNTER — Other Ambulatory Visit: Payer: Self-pay | Admitting: Family Medicine

## 2022-09-25 DIAGNOSIS — F332 Major depressive disorder, recurrent severe without psychotic features: Secondary | ICD-10-CM

## 2022-10-11 ENCOUNTER — Encounter: Payer: Self-pay | Admitting: Sports Medicine

## 2022-10-11 ENCOUNTER — Ambulatory Visit (INDEPENDENT_AMBULATORY_CARE_PROVIDER_SITE_OTHER): Payer: BC Managed Care – PPO

## 2022-10-11 ENCOUNTER — Ambulatory Visit: Payer: BC Managed Care – PPO | Admitting: Sports Medicine

## 2022-10-11 DIAGNOSIS — M47812 Spondylosis without myelopathy or radiculopathy, cervical region: Secondary | ICD-10-CM | POA: Diagnosis not present

## 2022-10-11 DIAGNOSIS — M2550 Pain in unspecified joint: Secondary | ICD-10-CM | POA: Diagnosis not present

## 2022-10-11 DIAGNOSIS — M5030 Other cervical disc degeneration, unspecified cervical region: Secondary | ICD-10-CM

## 2022-10-11 MED ORDER — GABAPENTIN 300 MG PO CAPS: ORAL_CAPSULE | ORAL | 3 refills | Status: DC

## 2022-10-11 MED ORDER — PREDNISONE 50 MG PO TABS: ORAL_TABLET | ORAL | 0 refills | Status: DC

## 2022-10-11 NOTE — Addendum Note (Signed)
Addended by: Monica Becton on: 10/11/2022 04:17 PM   Modules accepted: Orders

## 2022-10-11 NOTE — Assessment & Plan Note (Signed)
Martha Richardson does not have any clinical features of rheumatoid arthritis however she is significantly worried well. She had a family member with rheumatoid arthritis and is very traumatized by the appearance and the amount of pain her family member was in. Looking back I do not see that she has been tested, I am going to go and pull the trigger for rheumatoid arthritis testing, per patient request.

## 2022-10-11 NOTE — Progress Notes (Signed)
    Procedures performed today:    None.  Independent interpretation of notes and tests performed by another provider:   None.  Brief History, Exam, Impression, and Recommendations:    Polyarthralgia Denny Peon does not have any clinical features of rheumatoid arthritis however she is significantly worried well. She had a family member with rheumatoid arthritis and is very traumatized by the appearance and the amount of pain her family member was in. Looking back I do not see that she has been tested, I am going to go and pull the trigger for rheumatoid arthritis testing, per patient request.  Degenerative disc disease, cervical Pleasant 57 year old female, she has had several weeks of increasing pain right periscapular, itching, burning. She does have a known history of cervical DDD based on an MRI from 2017. Otherwise good motion, good strength. We discussed the anatomy and pathophysiology, I would like updated x-rays, home physical therapy per patient request, gabapentin and prednisone. She does have severe anxiety, she requested Xanax from me today, I will defer this to her PCP. I did explain to her that gabapentin may be helpful in reducing her anxiety as well.    ____________________________________________ Ihor Austin. Benjamin Stain, M.D., ABFM., CAQSM., AME. Primary Care and Sports Medicine [facility]  Adjunct Professor of Family Medicine  [employer]  Restaurant manager, fast food

## 2022-10-11 NOTE — Assessment & Plan Note (Signed)
Pleasant 57 year old female, she has had several weeks of increasing pain right periscapular, itching, burning. She does have a known history of cervical DDD based on an MRI from 2017. Otherwise good motion, good strength. We discussed the anatomy and pathophysiology, I would like updated x-rays, home physical therapy per patient request, gabapentin and prednisone. She does have severe anxiety, she requested Xanax from me today, I will defer this to her PCP. I did explain to her that gabapentin may be helpful in reducing her anxiety as well.

## 2022-10-16 LAB — RHEUMATOID ARTHRITIS PROFILE
Cyclic Citrullin Peptide Ab: 5 U (ref 0–19)
Rheumatoid fact SerPl-aCnc: <10 [IU]/mL (ref <14.0)

## 2022-10-24 ENCOUNTER — Other Ambulatory Visit: Payer: Self-pay | Admitting: Sports Medicine

## 2022-10-24 ENCOUNTER — Telehealth: Payer: Self-pay | Admitting: Internal Medicine

## 2022-10-24 DIAGNOSIS — M5030 Other cervical disc degeneration, unspecified cervical region: Secondary | ICD-10-CM

## 2022-10-24 MED ORDER — PREDNISONE 10 MG (48) PO TBPK: ORAL_TABLET | Freq: Every day | ORAL | 0 refills | Status: DC

## 2022-10-24 NOTE — Telephone Encounter (Signed)
I called and left her a detailed message that the copier is broke. Hopefully tomorrow we will know when that will be fixed. I wanted to ask if she wants me to get the instructions to her some other way...mail or her come pick up.

## 2022-10-24 NOTE — Telephone Encounter (Signed)
I called and left Denny Peon a detailed message to call me back to confirm her email and I can send her the instructions that way.

## 2022-10-24 NOTE — Telephone Encounter (Signed)
Patient returned your call and confirmed email on file.

## 2022-10-24 NOTE — Telephone Encounter (Signed)
Inbound call from patient requesting for prep instructions for 9/17 be sent again through mychart. Advised patient to look under letter in [place]. Patient requesting for instruction to still be sent. Please advise, thank you.

## 2022-10-25 NOTE — Telephone Encounter (Signed)
I spoke with Denny Peon and told her I have emailed the instructions to her. She will let me know if they don't come thru.

## 2022-11-02 ENCOUNTER — Encounter: Payer: Self-pay | Admitting: Certified Registered Nurse Anesthetist

## 2022-11-05 ENCOUNTER — Encounter: Payer: Self-pay | Admitting: Internal Medicine

## 2022-11-05 ENCOUNTER — Ambulatory Visit (AMBULATORY_SURGERY_CENTER): Payer: BC Managed Care – PPO | Admitting: Internal Medicine

## 2022-11-05 VITALS — BP 119/77 | HR 73 | Temp 97.3°F | Resp 13 | Ht 63.0 in | Wt 164.0 lb

## 2022-11-05 DIAGNOSIS — Z1211 Encounter for screening for malignant neoplasm of colon: Secondary | ICD-10-CM

## 2022-11-05 DIAGNOSIS — Z800 Family history of malignant neoplasm of digestive organs: Secondary | ICD-10-CM

## 2022-11-05 MED ORDER — SODIUM CHLORIDE 0.9 % IV SOLN: 500.0000 mL | Freq: Once | INTRAVENOUS | Status: DC

## 2022-11-05 NOTE — Progress Notes (Signed)
[facility] History and Physical   Primary Care Physician:  Agapito Games, MD   Reason for Procedure:   FHx CRCA  Plan:    colonoscopy     HPI: Martha Richardson is a 57 y.o. female w/ FHx CRCa father at 70 NL colonoscopy 2009 (diarrhea)  Recently seen 08/2022 - also had globus that I suspected was due to anxiety.  Past Medical History:  Diagnosis Date   ALCOHOL ABUSE 06/13/2008   Qualifier: Diagnosis of  By: Linford Arnold MD, Catherine     Anxiety    Arthritis    COUGH, CHRONIC 07/08/2008   Qualifier: Diagnosis of  By: Linford Arnold MD, Catherine     Kidney stones    LUNG NODULE 07/10/2008   Qualifier: Diagnosis of  By: Linford Arnold MD, Santina Evans      Past Surgical History:  Procedure Laterality Date   COLONOSCOPY     NASAL SEPTUM SURGERY Left    x 2   RHINOPLASTY Left     Prior to Admission medications   Medication Sig Start Date End Date Taking? Authorizing Provider  ALPRAZolam Prudy Feeler) 0.5 MG tablet Take 1 tablet (0.5 mg total) by mouth 2 (two) times daily as needed. 08/12/22   Breeback, Jade L, PA-C  busPIRone (BUSPAR) 5 MG tablet Take 1 tablet (5 mg total) by mouth 3 (three) times daily. 08/19/22   Breeback, Jade L, PA-C  gabapentin (NEURONTIN) 300 MG capsule One tab PO qHS for a week, then BID for a week, then TID. May double weekly to a max of 3,600mg /day 10/11/22   Monica Becton, MD  predniSONE (STERAPRED UNI-PAK 48 TAB) 10 MG (48) TBPK tablet Take by mouth daily. 12-day taper pack, use as directed for taper 10/24/22   Monica Becton, MD  Vilazodone HCl (VIIBRYD) 10 MG TABS Take 1 tablet (10 mg total) by mouth daily for 7 days, THEN 2 tablets (20 mg total) daily for 23 days. 08/15/22 09/14/22  Agapito Games, MD    Current Outpatient Medications  Medication Sig Dispense Refill   ALPRAZolam (XANAX) 0.5 MG tablet Take 1 tablet (0.5 mg total) by mouth 2 (two) times daily as needed. 45 tablet 0   busPIRone (BUSPAR) 5 MG tablet Take 1 tablet (5  mg total) by mouth 3 (three) times daily. 90 tablet 1   gabapentin (NEURONTIN) 300 MG capsule One tab PO qHS for a week, then BID for a week, then TID. May double weekly to a max of 3,600mg /day 90 capsule 3   predniSONE (STERAPRED UNI-PAK 48 TAB) 10 MG (48) TBPK tablet Take by mouth daily. 12-day taper pack, use as directed for taper 1 tablet 0   Vilazodone HCl (VIIBRYD) 10 MG TABS Take 1 tablet (10 mg total) by mouth daily for 7 days, THEN 2 tablets (20 mg total) daily for 23 days. 53 tablet 0   No current facility-administered medications for this visit.    Allergies as of 11/05/2022   (No Known Allergies)    Family History  Problem Relation Age of Onset   Hypertension Father    Colon cancer Father 41   Diabetes Father    COPD Brother    Emphysema Brother    Hypertension Brother    Heart disease Paternal Grandmother    Stomach cancer Paternal Grandfather     Social History   Socioeconomic History   Marital status: Married    Spouse name: Not on file   Number of children: 1   Years of  education: Not on file   Highest education level: Not on file  Occupational History   Occupation: Marine scientist  Tobacco Use   Smoking status: Every Day    Current packs/day: 0.25    Types: Cigarettes   Smokeless tobacco: Never  Vaping Use   Vaping status: Never Used  Substance and Sexual Activity   Alcohol use: Not Currently    Comment: couple of glasses of wine a night says she quit drinking   Drug use: No   Sexual activity: Not on file  Other Topics Concern   Not on file  Social History Narrative   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  Physical Exam: Vital signs There were no vitals taken for this visit.  General:   Alert,  Well-developed, well-nourished, pleasant and cooperative in NAD Lungs:  Clear throughout to auscultation.   Heart:  Regular rate and rhythm; no murmurs, clicks, rubs,  or gallops. Abdomen:  Soft, nontender and nondistended. Normal bowel sounds.   Neuro/Psych:  Alert and cooperative. Normal mood and affect. A and O x 3   @Latronda Spink  Sena Slate, MD, [facility] [phone] (pager) 11/05/2022 7:57 AM@

## 2022-11-05 NOTE — Op Note (Signed)
[facility] Patient Name: Martha Richardson Procedure Date: 11/05/2022 8:23 AM MRN: [id number] Endoscopist: Iva Boop , MD, [id number] Age: 57 Referring MD:  Date of Birth: 1966/01/20 Gender: Female Account #: [id number] Procedure:                Colonoscopy Indications:              Screening in patient at increased risk: Colorectal                            cancer in father before age 50 Medicines:                Monitored Anesthesia Care Procedure:                Pre-Anesthesia Assessment:                           - Prior to the procedure, a History and Physical                            was performed, and patient medications and                            allergies were reviewed. The patient's tolerance of                            previous anesthesia was also reviewed. The risks                            and benefits of the procedure and the sedation                            options and risks were discussed with the patient.                            All questions were answered, and informed consent                            was obtained. Prior Anticoagulants: The patient has                            taken no anticoagulant or antiplatelet agents. ASA                            Grade Assessment: II - A patient with mild systemic                            disease. After reviewing the risks and benefits,                            the patient was deemed in satisfactory condition to                            undergo the procedure.  After obtaining informed consent, the colonoscope                            was passed under direct vision. Throughout the                            procedure, the patient's blood pressure, pulse, and                            oxygen saturations were monitored continuously. The                            Olympus Scope SN: [id number] was introduced through                            the anus and advanced to  the the cecum, identified                            by appendiceal orifice and ileocecal valve. The                            colonoscopy was performed without difficulty. The                            patient tolerated the procedure well. The quality                            of the bowel preparation was good. The ileocecal                            valve, appendiceal orifice, and rectum were                            photographed. The bowel preparation used was                            Miralax via split dose instruction. Scope In: 8:37:48 AM Scope Out: 8:47:23 AM Scope Withdrawal Time: 0 hours 7 minutes 29 seconds  Total Procedure Duration: 0 hours 9 minutes 35 seconds  Findings:                 The perianal and digital rectal examinations were                            normal.                           The entire examined colon appeared normal on direct                            and retroflexion views. Complications:            No immediate complications. Estimated Blood Loss:     Estimated blood loss: none. Impression:               - The entire  examined colon is normal on direct and                            retroflexion views.                           - No specimens collected. Recommendation:           - Patient has a contact number available for                            emergencies. The signs and symptoms of potential                            delayed complications were discussed with the                            patient. Return to normal activities tomorrow.                            Written discharge instructions were provided to the                            patient.                           - Resume previous diet.                           - Continue present medications.                           - Repeat colonoscopy in 5 years for screening                            purposes. Iva Boop, MD 11/05/2022 8:53:20 AM This report has been signed  electronically.

## 2022-11-05 NOTE — Progress Notes (Signed)
Report given to PACU, vss 

## 2022-11-05 NOTE — Patient Instructions (Addendum)
Please read handouts provided. Continue present medications. Repeat colonoscopy in 5 years for screening.   YOU HAD AN ENDOSCOPIC PROCEDURE TODAY AT THE [facility]:   Refer to the procedure report that was given to you for any specific questions about what was found during the examination.  If the procedure report does not answer your questions, please call your gastroenterologist to clarify.  If you requested that your care partner not be given the details of your procedure findings, then the procedure report has been included in a sealed envelope for you to review at your convenience later.  YOU SHOULD EXPECT: Some feelings of bloating in the abdomen. Passage of more gas than usual.  Walking can help get rid of the air that was put into your GI tract during the procedure and reduce the bloating. If you had a lower endoscopy (such as a colonoscopy or flexible sigmoidoscopy) you may notice spotting of blood in your stool or on the toilet paper. If you underwent a bowel prep for your procedure, you may not have a normal bowel movement for a few days.  Please Note:  You might notice some irritation and congestion in your nose or some drainage.  This is from the oxygen used during your procedure.  There is no need for concern and it should clear up in a day or so.  SYMPTOMS TO REPORT IMMEDIATELY:  Following lower endoscopy (colonoscopy or flexible sigmoidoscopy):  Excessive amounts of blood in the stool  Significant tenderness or worsening of abdominal pains  Swelling of the abdomen that is new, acute  Fever of 100F or higher.  For urgent or emergent issues, a gastroenterologist can be reached at any hour by calling [phone]. Do not use MyChart messaging for urgent concerns.    DIET:  We do recommend a small meal at first, but then you may proceed to your regular diet.  Drink plenty of fluids but you should avoid alcoholic beverages for 24 hours.  ACTIVITY:  You should plan  to take it easy for the rest of today and you should NOT DRIVE or use heavy machinery until tomorrow (because of the sedation medicines used during the test).    FOLLOW UP: Our staff will call the number listed on your records the next business day following your procedure.  We will call around 7:15- 8:00 am to check on you and address any questions or concerns that you may have regarding the information given to you following your procedure. If we do not reach you, we will leave a message.     If any biopsies were taken you will be contacted by phone or by letter within the next 1-3 weeks.  Please call us at [phone] if you have not heard about the biopsies in 3 weeks.    SIGNATURES/CONFIDENTIALITY: You and/or your care partner have signed paperwork which will be entered into your electronic medical record.  These signatures attest to the fact that that the information above on your After Visit Summary has been reviewed and is understood.  Full responsibility of the confidentiality of this discharge information lies with you and/or your care-partner.No polyps or cancer were seen.  Your next routine colonoscopy should be in 5 years - 2029.  I appreciate the opportunity to care for you. Iva Boop, MD, [facility]

## 2022-11-06 ENCOUNTER — Telehealth: Payer: Self-pay

## 2022-11-06 NOTE — Telephone Encounter (Signed)
Opened in duplicate, please see other note.

## 2022-11-06 NOTE — Telephone Encounter (Signed)
  Follow up Call-     11/05/2022    8:03 AM  Call back number  Post procedure Call Back phone  # [phone]  Permission to leave phone message Yes     Patient questions:  Do you have a fever, pain , or abdominal swelling? No. Pain Score  0 *  Have you tolerated food without any problems? Yes.    Have you been able to return to your normal activities? Yes.    Do you have any questions about your discharge instructions: Diet   No. Medications  No. Follow up visit  no  Do you have questions or concerns about your Care? No.  Actions: * If pain score is 4 or above: No action needed, pain <4.

## 2022-11-27 ENCOUNTER — Encounter: Payer: Self-pay | Admitting: Sports Medicine

## 2022-11-27 ENCOUNTER — Ambulatory Visit: Payer: BC Managed Care – PPO | Admitting: Sports Medicine

## 2022-11-27 DIAGNOSIS — L739 Follicular disorder, unspecified: Secondary | ICD-10-CM | POA: Diagnosis not present

## 2022-11-27 DIAGNOSIS — M542 Cervicalgia: Secondary | ICD-10-CM | POA: Diagnosis not present

## 2022-11-27 DIAGNOSIS — M5412 Radiculopathy, cervical region: Secondary | ICD-10-CM

## 2022-11-27 DIAGNOSIS — Z1283 Encounter for screening for malignant neoplasm of skin: Secondary | ICD-10-CM | POA: Diagnosis not present

## 2022-11-27 DIAGNOSIS — M5030 Other cervical disc degeneration, unspecified cervical region: Secondary | ICD-10-CM

## 2022-11-27 DIAGNOSIS — H02822 Cysts of right lower eyelid: Secondary | ICD-10-CM | POA: Diagnosis not present

## 2022-11-27 MED ORDER — TRAMADOL HCL 50 MG PO TABS
50.0000 mg | ORAL_TABLET | Freq: Three times a day (TID) | ORAL | 0 refills | Status: DC | PRN
Start: 2022-11-27 — End: 2022-12-10

## 2022-11-27 NOTE — Progress Notes (Signed)
    Procedures performed today:    None.  Independent interpretation of notes and tests performed by another provider:   None.  Brief History, Exam, Impression, and Recommendations:    Degenerative disc disease, cervical This pleasant 57 year old female returns, she has chronic neck pain with right periscapular radiating itching and burning sensations, we treated her fairly aggressively with prednisone, gabapentin, she is currently up to at least 5 gabapentin pills through the day. She also ended up getting a 12-day prednisone taper, she has also been doing home physical therapy. Unfortunately all of the above has not been effective for greater than 6 weeks. We will now proceed with MRI for epidural planning, adding tramadol for pain relief in the meantime. She would like to do a virtual visit to go over the MRI images while I order the epidural.  I spent 30 minutes of total time managing this patient today, this includes chart review, face to face, and non-face to face time.  ____________________________________________ Ihor Austin. Benjamin Stain, M.D., ABFM., CAQSM., AME. Primary Care and Sports Medicine [facility]  Adjunct Professor of Family Medicine  [employer] [facility] [employer]  Restaurant manager, fast food

## 2022-11-27 NOTE — Assessment & Plan Note (Addendum)
This pleasant 57 year old female returns, she has chronic neck pain with right periscapular radiating itching and burning sensations, we treated her fairly aggressively with prednisone, gabapentin, she is currently up to at least 5 gabapentin pills through the day. She also ended up getting a 12-day prednisone taper, she has also been doing home physical therapy. Unfortunately all of the above has not been effective for greater than 6 weeks. We will now proceed with MRI for epidural planning, adding tramadol for pain relief in the meantime. She would like to do a virtual visit to go over the MRI images while I order the epidural.

## 2022-11-29 DIAGNOSIS — Z124 Encounter for screening for malignant neoplasm of cervix: Secondary | ICD-10-CM | POA: Diagnosis not present

## 2022-11-29 DIAGNOSIS — Z6829 Body mass index (BMI) 29.0-29.9, adult: Secondary | ICD-10-CM | POA: Diagnosis not present

## 2022-11-29 DIAGNOSIS — Z01419 Encounter for gynecological examination (general) (routine) without abnormal findings: Secondary | ICD-10-CM | POA: Diagnosis not present

## 2022-11-29 DIAGNOSIS — Z1231 Encounter for screening mammogram for malignant neoplasm of breast: Secondary | ICD-10-CM | POA: Diagnosis not present

## 2022-12-01 ENCOUNTER — Other Ambulatory Visit: Payer: BC Managed Care – PPO

## 2022-12-10 ENCOUNTER — Other Ambulatory Visit: Payer: Self-pay | Admitting: Sports Medicine

## 2022-12-10 DIAGNOSIS — M5412 Radiculopathy, cervical region: Secondary | ICD-10-CM

## 2022-12-10 MED ORDER — TRAMADOL HCL 50 MG PO TABS: 50.0000 mg | ORAL_TABLET | Freq: Three times a day (TID) | ORAL | 0 refills | Status: DC | PRN

## 2022-12-10 NOTE — Telephone Encounter (Signed)
Requesting rx rf of tramadol 50mg  Last written 11/27/2022 as Rob Bunting #15 Last  OV 11/27/2022 No upcoming appt schld.

## 2022-12-13 ENCOUNTER — Encounter (INDEPENDENT_AMBULATORY_CARE_PROVIDER_SITE_OTHER): Payer: BC Managed Care – PPO | Admitting: Sports Medicine

## 2022-12-13 DIAGNOSIS — M5030 Other cervical disc degeneration, unspecified cervical region: Secondary | ICD-10-CM | POA: Diagnosis not present

## 2022-12-13 DIAGNOSIS — M5412 Radiculopathy, cervical region: Secondary | ICD-10-CM

## 2022-12-18 ENCOUNTER — Ambulatory Visit
Admission: RE | Admit: 2022-12-18 | Discharge: 2022-12-18 | Disposition: A | Discharge status: Home / Self Care | Payer: BC Managed Care – PPO | Source: Ambulatory Visit | Attending: Sports Medicine | Admitting: Sports Medicine

## 2022-12-18 DIAGNOSIS — M5412 Radiculopathy, cervical region: Secondary | ICD-10-CM

## 2022-12-18 DIAGNOSIS — M542 Cervicalgia: Secondary | ICD-10-CM

## 2022-12-19 NOTE — Telephone Encounter (Signed)

## 2022-12-27 MED ORDER — TRAMADOL HCL 50 MG PO TABS: 50.0000 mg | ORAL_TABLET | Freq: Three times a day (TID) | ORAL | 0 refills | Status: DC | PRN

## 2022-12-27 NOTE — Addendum Note (Signed)
Addended by: Monica Becton on: 12/27/2022 05:15 PM   Modules accepted: Orders

## 2022-12-27 NOTE — Addendum Note (Signed)
Addended by: Chalmers Cater on: 12/27/2022 02:52 PM   Modules accepted: Orders

## 2023-01-10 ENCOUNTER — Encounter: Payer: Self-pay | Admitting: Sports Medicine

## 2023-01-10 DIAGNOSIS — M5030 Other cervical disc degeneration, unspecified cervical region: Secondary | ICD-10-CM

## 2023-01-24 ENCOUNTER — Encounter: Payer: Self-pay | Admitting: Sports Medicine

## 2023-01-27 NOTE — Discharge Instructions (Signed)
Post Procedure Spinal Discharge Instruction Sheet  You may resume a regular diet and any medications that you routinely take (including pain medications) unless otherwise noted by MD.  No driving day of procedure.  Light activity throughout the rest of the day.  Do not do any strenuous work, exercise, bending or lifting.  The day following the procedure, you can resume normal physical activity but you should refrain from exercising or physical therapy for at least three days thereafter.  You may apply ice to the injection site, 20 minutes on, 20 minutes off, as needed. Do not apply ice directly to skin.    Common Side Effects:  Headaches- take your usual medications as directed by your physician.  Increase your fluid intake.  Caffeinated beverages may be helpful.  Lie flat in bed until your headache resolves.  Restlessness or inability to sleep- you may have trouble sleeping for the next few days.  Ask your referring physician if you need any medication for sleep.  Facial flushing or redness- should subside within a few days.  Increased pain- a temporary increase in pain a day or two following your procedure is not unusual.  Take your pain medication as prescribed by your referring physician.  Leg cramps  Please contact our office at [phone] for the following symptoms: Fever greater than 100 degrees. Headaches unresolved with medication after 2-3 days. Increased swelling, pain, or redness at injection site.   Thank you for visiting [facility] today.   YOU MAY RESTART YOUR ASPIRIN TODAY.

## 2023-01-28 ENCOUNTER — Inpatient Hospital Stay
Admission: RE | Admit: 2023-01-28 | Discharge: 2023-01-28 | Disposition: A | Discharge status: Home / Self Care | Payer: BC Managed Care – PPO | Source: Ambulatory Visit | Attending: Sports Medicine | Admitting: Sports Medicine

## 2023-04-17 DIAGNOSIS — H04223 Epiphora due to insufficient drainage, bilateral lacrimal glands: Secondary | ICD-10-CM | POA: Diagnosis not present

## 2023-04-17 DIAGNOSIS — D485 Neoplasm of uncertain behavior of skin: Secondary | ICD-10-CM | POA: Diagnosis not present

## 2023-04-17 DIAGNOSIS — H04563 Stenosis of bilateral lacrimal punctum: Secondary | ICD-10-CM | POA: Diagnosis not present

## 2023-04-17 DIAGNOSIS — H0279 Other degenerative disorders of eyelid and periocular area: Secondary | ICD-10-CM | POA: Diagnosis not present

## 2023-06-05 DIAGNOSIS — D485 Neoplasm of uncertain behavior of skin: Secondary | ICD-10-CM | POA: Diagnosis not present

## 2023-06-05 DIAGNOSIS — B079 Viral wart, unspecified: Secondary | ICD-10-CM | POA: Diagnosis not present

## 2023-06-05 DIAGNOSIS — D23112 Other benign neoplasm of skin of right lower eyelid, including canthus: Secondary | ICD-10-CM | POA: Diagnosis not present

## 2023-10-13 ENCOUNTER — Ambulatory Visit: Payer: Self-pay

## 2023-10-13 NOTE — Telephone Encounter (Signed)
 See if ok to see our sports med doc in [facility]

## 2023-10-13 NOTE — Telephone Encounter (Signed)
  FYI Only or Action Required?: FYI only for provider.  Patient was last seen in primary care on 11/27/2022 by Curtis Debby PARAS, MD.  Called Nurse Triage reporting Shoulder Pain.  Symptoms began 2 months.  Interventions attempted: Nothing.  Symptoms are: gradually worsening.  Triage Disposition: See PCP When Office is Open (Within 3 Days)  Patient/caregiver understands and will follow disposition?: chart sent to office for scheduling pt with a Sports med doctor.      Copied from CRM [id number]. Topic: Clinical - Red Word Triage >> Oct 13, 2023  2:01 PM Corin V wrote: [facility] that prompted transfer to Nurse Triage: Patient is having severe pain in right shoulder radiating down back and arm. She previously saw Dr. ONEIDA for this and is unsure if she needs to see PCP or sports medication doctor. She is unsure if pains are connected or what could have caused sudden change to the shoulder pain Reason for Disposition  [1] MODERATE pain (e.g., interferes with normal activities) AND [2] present > 3 days  Answer Assessment - Initial Assessment Questions 1. ONSET: When did the pain start?     2 months  2. LOCATION: Where is the pain located?     Right shoulder 3. PAIN: How bad is the pain? (Scale 1-10; or mild, moderate, severe)     7 4. WORK OR EXERCISE: Has there been any recent work or exercise that involved this part of the body?     no 5. CAUSE: What do you think is causing the shoulder pain?     arthritis 6. OTHER SYMPTOMS: Do you have any other symptoms? (e.g., neck pain, swelling, rash, fever, numbness, weakness)     no  Protocols used: Shoulder Pain-A-AH

## 2023-10-14 DIAGNOSIS — M25511 Pain in right shoulder: Secondary | ICD-10-CM | POA: Diagnosis not present

## 2023-10-14 NOTE — Telephone Encounter (Addendum)
 I was able to contact patient and advise of recommendations to Sports Med in [facility]. Patient stated she will actually be heading to [facility] today. Patient mentioned [facility] is closer to home, where as [facility] is about 30 minutes away.

## 2023-10-21 ENCOUNTER — Encounter: Payer: Self-pay | Admitting: Sports Medicine

## 2023-10-29 ENCOUNTER — Ambulatory Visit: Admitting: Physician Assistant

## 2023-10-29 ENCOUNTER — Ambulatory Visit: Payer: Self-pay | Admitting: *Deleted

## 2023-10-29 VITALS — BP 122/77 | HR 77

## 2023-10-29 DIAGNOSIS — L732 Hidradenitis suppurativa: Secondary | ICD-10-CM

## 2023-10-29 DIAGNOSIS — F419 Anxiety disorder, unspecified: Secondary | ICD-10-CM | POA: Diagnosis not present

## 2023-10-29 DIAGNOSIS — Z23 Encounter for immunization: Secondary | ICD-10-CM | POA: Diagnosis not present

## 2023-10-29 DIAGNOSIS — F1020 Alcohol dependence, uncomplicated: Secondary | ICD-10-CM | POA: Diagnosis not present

## 2023-10-29 MED ORDER — NALTREXONE HCL 50 MG PO TABS: ORAL_TABLET | ORAL | 2 refills | Status: AC

## 2023-10-29 MED ORDER — ALPRAZOLAM 0.5 MG PO TABS: 0.5000 mg | ORAL_TABLET | Freq: Two times a day (BID) | ORAL | 1 refills | Status: DC | PRN

## 2023-10-29 MED ORDER — SPIRONOLACTONE 25 MG PO TABS: ORAL_TABLET | ORAL | 2 refills | Status: DC

## 2023-10-29 MED ORDER — DOXYCYCLINE HYCLATE 100 MG PO TABS
100.0000 mg | ORAL_TABLET | Freq: Two times a day (BID) | ORAL | 0 refills | Status: DC
Start: 2023-10-29 — End: 2023-12-31

## 2023-10-29 MED ORDER — NICOTINE 21 MG/24HR TD PT24: 21.0000 mg | MEDICATED_PATCH | Freq: Every day | TRANSDERMAL | 1 refills | Status: DC

## 2023-10-29 MED ORDER — CLINDAMYCIN PHOS-BENZOYL PEROX 1.2-5 % EX GEL: 1.0000 | Freq: Two times a day (BID) | CUTANEOUS | 1 refills | Status: AC

## 2023-10-29 NOTE — Progress Notes (Unsigned)
 Acute Office Visit  Subjective:     Patient ID: Martha Richardson, female    DOB: October 22, 1965, 58 y.o.   MRN: [id number]  Chief Complaint  Patient presents with   Medical Management of Chronic Issues    HPI Patient with history of cigarette use is in today for recurrent skin lesions on her underarms and groin. The lesions have been recurrent for over a year. She states the lesions will appear in the armpits and groin as red painful bumps that quickly develop a white head. She applies a warm compress to them which helps them drain then they scar. She said this is the cycle the lesions have been appearing since they started. She has seen multiple providers who called it folliculitis and have tried treating her unsuccessfully in the past but she can't remember what medications she has tried. She currently cleans them frequently with Hibiclens but she doesn't think it's working. They are painful to the touch and irritated by her clothes rubbing on them. Only new lesions are painful and once they drain, the pain subsides. She states it is affecting her quality of life at this point and it needs to be managed.   Tobacco use was discussed. She is using 21mg  Nicoderm patches daily to curb her cravings which she states has been helping. She smoked one cigarette today and states she will occasionally still smoke but wants to quit for good. Requesting refill on Nicoderm patches.   Alcohol use was also discussed today. She asked about possibly beginning a medication to help her quit drinking alcohol but does not want to take Antabuse. She has stopped drinking vodka and now only drinks wine on the weekends. She feels as though she always needs to have a bottle of wine in the house to ease her mind and she can finish an entire bottle in one day without feeling the effects from the alcohol. Despite feeling no physical effects, she does admit to feeling dependent on it and she is ready and willing to cut back the  amount she is drinking and start Naltrexone .   Also requesting refill of Xanax .   ROS      Objective:    BP 122/77   Pulse 77   SpO2 99%  {Vitals History (Optional):23777}  Physical Exam  No results found for any visits on 10/29/23.      Assessment & Plan:   Problem List Items Addressed This Visit       Other   Anxiety   Relevant Medications   ALPRAZolam  (XANAX ) 0.5 MG tablet   Other Visit Diagnoses       Immunization due    -  Primary   Relevant Orders   Flu vaccine trivalent PF, 6mos and older(Flulaval,Afluria,Fluarix,Fluzone) (Completed)     Hidradenitis suppurativa           Meds ordered this encounter  Medications   doxycycline  (VIBRA -TABS) 100 MG tablet    Sig: Take 1 tablet (100 mg total) by mouth 2 (two) times daily.    Dispense:  20 tablet    Refill:  0    Supervising Provider:   METHENEY, CATHERINE D [[id number]]   Clindamycin -Benzoyl Per, Refr, gel    Sig: Apply 1 Application topically 2 (two) times daily.    Dispense:  45 g    Refill:  1    Supervising Provider:   METHENEY, CATHERINE D [[id number]]   spironolactone  (ALDACTONE ) 25 MG tablet    Sig: Take one tablet  daily for 7 days then increase to 2 tablets daily.    Dispense:  60 tablet    Refill:  2    Supervising Provider:   METHENEY, CATHERINE D [[id number]]   naltrexone  (DEPADE) 50 MG tablet    Sig: One half tab daily for a month then one half tab twice a day    Dispense:  30 tablet    Refill:  2    Supervising Provider:   METHENEY, CATHERINE D [[id number]]   nicotine  (NICODERM CQ  - DOSED IN MG/24 HOURS) 21 mg/24hr patch    Sig: Place 1 patch (21 mg total) onto the skin daily.    Dispense:  28 patch    Refill:  1    Supervising Provider:   METHENEY, CATHERINE D [[id number]]   ALPRAZolam  (XANAX ) 0.5 MG tablet    Sig: Take 1 tablet (0.5 mg total) by mouth 2 (two) times daily as needed.    Dispense:  45 tablet    Refill:  1    Supervising Provider:   METHENEY, CATHERINE D [[id number]]    Return in about 3 months  (around 01/28/2024) for with PcP.  [address] [place], Student-PA

## 2023-10-29 NOTE — Patient Instructions (Addendum)
 Hidradenitis Suppurativa Hidradenitis suppurativa is a long-term (chronic) skin disease. It is similar to a severe form of acne, but it affects areas of the body where acne would be unusual, especially areas of the body where skin rubs against skin and becomes moist. These include: Underarms. Groin. Genital area. Buttocks. Upper thighs. Breasts. Hidradenitis suppurativa may start out as small lumps or pimples caused by blocked skin pores, sweat glands, or hair follicles. Pimples may develop into deep sores that break open (rupture) and drain pus. Over time, affected areas of skin may thicken and become scarred. This condition is rare and does not spread from person to person (non-contagious). What are the causes? The exact cause of this condition is not known. It may be related to: Female and female hormones. An overactive disease-fighting system (immune system). The immune system may over-react to blocked hair follicles or sweat glands and cause swelling and pus-filled sores. What increases the risk? You are more likely to develop this condition if you: Are female. Are 31-39 years old. Have a family history of hidradenitis suppurativa. Have a personal history of acne. Are overweight. Smoke. Take the medicine lithium. What are the signs or symptoms? The first symptoms are usually painful bumps in the skin, similar to pimples. The condition may get worse over time (progress), or it may only cause mild symptoms. If the disease progresses, symptoms may include: Skin bumps getting bigger and growing deeper into the skin. Bumps rupturing and draining pus. Itchy, infected skin. Skin getting thicker and scarred. Tunnels under the skin (fistulas) where pus drains from a bump. Pain during daily activities, such as pain during walking if your groin area is affected. Emotional problems, such as stress or depression. This condition may affect your appearance and your ability or willingness to wear  certain clothes or do certain activities. How is this diagnosed? This condition is diagnosed by a health care provider who specializes in skin conditions (dermatologist). You may be diagnosed based on: Your symptoms and medical history. A physical exam. Testing a pus sample for infection. Blood tests. How is this treated? Your treatment will depend on how severe your symptoms are. The same treatment will not work for everybody with this condition. You may need to try several treatments to find what works best for you. Treatment may include: Cleaning and bandaging (dressing) your wounds as needed. Lifestyle changes, such as new skin care routines. Taking medicines, such as: Antibiotics. Acne medicines. Medicines to reduce the activity of the immune system. A diabetes medicine (metformin). Birth control pills, for women. Steroids to reduce swelling and pain. Working with a mental health care provider, if you experience emotional distress due to this condition. If you have severe symptoms that do not get better with medicine, you may need surgery. Surgery may involve: Using a laser to clear the skin and remove hair follicles. Opening and draining deep sores. Removing the areas of skin that are diseased and scarred. Follow these instructions at home: Medicines  Take over-the-counter and prescription medicines only as told by your health care provider. If you were prescribed antibiotics, take them as told by your health care provider. Do not stop using the antibiotic even if your condition improves. Skin care If you have open wounds, cover them with a clean dressing as told by your health care provider. Keep wounds clean by washing them gently with soap and water when you bathe. Do not shave the areas where you get hidradenitis suppurativa. Wear loose-fitting clothes. Try to avoid  getting overheated or sweaty. If you get sweaty or wet, change into clean, dry clothes as soon as you can. To  help relieve pain and itchiness, cover sore areas with a warm, clean washcloth (warm compress) for 5-10 minutes as often as needed. Your healthcare provider may recommend an antiperspirant deodorant that may be gentle on your skin. A daily antiseptic wash to cleanse affected areas may be suggested by your healthcare provider. General instructions Learn as much as you can about your disease so that you have an active role in your treatment. Work closely with your health care provider to find treatments that work for you. If you are overweight, work with your health care provider to lose weight as recommended. Do not use any products that contain nicotine  or tobacco. These products include cigarettes, chewing tobacco, and vaping devices, such as e-cigarettes. If you need help quitting, ask your health care provider. If you struggle with living with this condition, talk with your health care provider or work with a mental health care provider as recommended. Keep all follow-up visits. Where to find more information Hidradenitis Suppurativa Foundation, Inc.: www.hs-foundation.org American Academy of Dermatology: InfoExam.si Contact a health care provider if: You have a flare-up of hidradenitis suppurativa. You have a fever or chills. You have trouble controlling your symptoms at home. You have trouble doing your daily activities because of your symptoms. You have trouble dealing with emotional problems related to your condition. Summary Hidradenitis suppurativa is a long-term (chronic) skin disease. It is similar to a severe form of acne, but it affects areas of the body where acne would be unusual. The first symptoms are usually painful bumps in the skin, similar to pimples. The condition may only cause mild symptoms, or it may get worse over time (progress). If you have open wounds, cover them with a clean dressing as told by your health care provider. Keep wounds clean by washing them gently with  soap and water when you bathe. Besides skin care, treatment may include medicines, laser treatment, and surgery. This information is not intended to replace advice given to you by your health care provider. Make sure you discuss any questions you have with your health care provider. Document Revised: 03/28/2021 Document Reviewed: 03/28/2021 Elsevier Patient Education  2024 Elsevier Inc.na

## 2023-10-29 NOTE — Telephone Encounter (Signed)
 Appointment with Jade.

## 2023-10-29 NOTE — Telephone Encounter (Signed)
 FYI Only or Action Required?: FYI only for provider.  Patient was last seen in primary care on 11/27/2022 by Curtis Debby PARAS, MD.  Called Nurse Triage reporting Mass.  Symptoms began na.  Interventions attempted: Rest, hydration, or home remedies.  Symptoms are: rapidly worsening.  Triage Disposition: See Physician Within 24 Hours  Patient/caregiver understands and will follow disposition?: Yes               Copied from CRM [id number]. Topic: Clinical - Red Word Triage >> Oct 29, 2023  8:22 AM Alfonso ORN wrote: Red Word that prompted transfer to Nurse Triage: pimples and boils in arm pits and in her pubic region area and her bottom buttock area, may be a skin issues , its a new one everyday , painful and red ,swollen with pus , rate pain 4    ----------------------------------------------------------------------- From previous Reason for Contact - Scheduling: Patient/patient representative is calling to schedule an appointment. Refer to attachments for appointment information. Reason for Disposition  2 or more boils  Answer Assessment - Initial Assessment Questions Appt scheduled today with other provider. None available with PCP.      1. APPEARANCE of BOIL: What does the boil look like?      Red swelling with pus  2. LOCATION: Where is the boil located?      Under arms pits, buttocks, pubic area  3. NUMBER: How many boils are there?      Multiple  4. SIZE: How big is the boil? (e.g., inches, cm; compare to size of a coin or other object)     Size of pea 5. ONSET: When did the boil start?     On and off for years 6. PAIN: Is there any pain? If Yes, ask: How bad is the pain?   (Scale 1-10; or mild, moderate, severe)     4/10 7. FEVER: Do you have a fever? If Yes, ask: What is it, how was it measured, and when did it start?      no 8. SOURCE: Have you been around anyone with boils or other Staph infections? Have you ever had boils  before?     na 9. OTHER SYMPTOMS: Do you have any other symptoms? (e.g., shaking chills, weakness, rash elsewhere on body)     Pain  swelling  boils draining yellow pus. Red. 10. PREGNANCY: Is there any chance you are pregnant? When was your last menstrual period?       na  Protocols used: Boil (Skin Abscess)-A-AH

## 2023-10-30 DIAGNOSIS — M25511 Pain in right shoulder: Secondary | ICD-10-CM | POA: Diagnosis not present

## 2023-10-31 ENCOUNTER — Encounter: Payer: Self-pay | Admitting: Physician Assistant

## 2023-10-31 DIAGNOSIS — L732 Hidradenitis suppurativa: Secondary | ICD-10-CM | POA: Insufficient documentation

## 2023-11-26 ENCOUNTER — Telehealth: Payer: Self-pay | Admitting: Family Medicine

## 2023-11-26 NOTE — Telephone Encounter (Signed)
 Copied from CRM [id number]. Topic: Appointments - Scheduling Inquiry for Clinic >> Nov 26, 2023  3:45 PM Jasmin G wrote: Reason for CRM: Pt called to request an X-Ray on her knee due to experiencing an injury about a year ago. Pt wanted to see if it could be done on the same day of her next appt on Nov 12th. Call pt back at [phone] to discuss.

## 2023-12-03 DIAGNOSIS — Z6828 Body mass index (BMI) 28.0-28.9, adult: Secondary | ICD-10-CM | POA: Diagnosis not present

## 2023-12-03 DIAGNOSIS — Z1231 Encounter for screening mammogram for malignant neoplasm of breast: Secondary | ICD-10-CM | POA: Diagnosis not present

## 2023-12-03 DIAGNOSIS — Z01419 Encounter for gynecological examination (general) (routine) without abnormal findings: Secondary | ICD-10-CM | POA: Diagnosis not present

## 2023-12-05 ENCOUNTER — Telehealth: Payer: Self-pay | Admitting: *Deleted

## 2023-12-05 NOTE — Telephone Encounter (Signed)
 Spoke w/pt she would like to have xray of R knee done at her appointment

## 2023-12-31 ENCOUNTER — Ambulatory Visit

## 2023-12-31 ENCOUNTER — Ambulatory Visit: Admitting: Family Medicine

## 2023-12-31 ENCOUNTER — Encounter: Payer: Self-pay | Admitting: Family Medicine

## 2023-12-31 VITALS — BP 112/67 | HR 77 | Ht 63.0 in | Wt 161.3 lb

## 2023-12-31 DIAGNOSIS — M25561 Pain in right knee: Secondary | ICD-10-CM | POA: Diagnosis not present

## 2023-12-31 DIAGNOSIS — E538 Deficiency of other specified B group vitamins: Secondary | ICD-10-CM | POA: Diagnosis not present

## 2023-12-31 DIAGNOSIS — E785 Hyperlipidemia, unspecified: Secondary | ICD-10-CM

## 2023-12-31 DIAGNOSIS — E559 Vitamin D deficiency, unspecified: Secondary | ICD-10-CM | POA: Diagnosis not present

## 2023-12-31 DIAGNOSIS — Z23 Encounter for immunization: Secondary | ICD-10-CM

## 2023-12-31 NOTE — Progress Notes (Signed)
 Established Patient Office Visit  Patient ID: Martha Richardson, female    DOB: [birth year]  Age: 58 y.o. MRN: [id number] PCP: Alvan Dorothyann BIRCH, MD  Chief Complaint  Patient presents with   Medical Management of Chronic Issues    Subjective:     HPI  Discussed the use of AI scribe software for clinical note transcription with the patient, who gave verbal consent to proceed.  History of Present Illness Martha Richardson is a 58 year old female who presents with severe facial pain during flights.  Facial pain associated with air travel - Severe facial pain occurred during recent flights, beginning 15 minutes after takeoff and resolving upon descent. - Pain originates in the teeth and radiates up the left side of face, making it unbearable to touch. - Sensation described as similar to numbness after dental procedures with Novocain. - Pain intensity causes tears and nausea. - Symptoms occurred on both outbound and return flights between [place] and [place]. - No history of migraines. - No current dental issues. - Concerned about recurrence with upcoming flight and seeks preventive measures.  Knee pain and functional impairment - Chronic knee pain after a fall, with worsening severity over time. - Increased difficulty descending stairs and rising from a seated position, such as getting off the toilet, since a fall down the stairs last year. - Pain is severe and impacts daily activities. - No popping or cracking in the knee.  Shoulder pain - Shoulder pain has improved with medication received from ortho .     ROS    Objective:     BP 112/67   Pulse 77   Ht 5' 3 (1.6 m)   Wt 161 lb 4.8 oz (73.2 kg)   SpO2 100%   BMI 28.57 kg/m    Physical Exam Vitals reviewed.  Constitutional:      Appearance: Normal appearance.  HENT:     Head: Normocephalic.  Pulmonary:     Effort: Pulmonary effort is normal.  Musculoskeletal:     Comments: Crepitus with movement of the  right knee. Tender along sup/medial edge of the patella.   Neurological:     Mental Status: She is alert and oriented to person, place, and time.  Psychiatric:        Mood and Affect: Mood normal.        Behavior: Behavior normal.      No results found for any visits on 12/31/23.    The ASCVD Risk score (Arnett DK, et al., 2019) failed to calculate for the following reasons:   The valid HDL cholesterol range is 20 to 100 mg/dL    Assessment & Plan:   Problem List Items Addressed This Visit       Other   Vitamin D  deficiency   Relevant Orders   CMP14+EGFR   Lipid panel   CBC   B12   Low serum vitamin B12   Relevant Orders   CMP14+EGFR   Lipid panel   CBC   B12   Hyperlipidemia   Relevant Orders   CMP14+EGFR   Lipid panel   CBC   B12   Other Visit Diagnoses       Encounter for immunization    -  Primary   Relevant Orders   Varicella-zoster vaccine IM (Completed)     Patellar pain, right       Relevant Orders   DG Knee Complete 4 Views Right       Assessment and Plan  Assessment & Plan Adult Wellness Visit Routine wellness visit with recent mammogram and Pap smear. Blood work last done in July 2024. - Ordered CMP, lipid panel, and CBC for blood work. - Scheduled blood work appointment.  Knee pain, Right  Chronic knee pain exacerbated by certain movements. Previous fall noted. - Ordered knee x-ray to assess for structural issues.  Left sided Sinus pain with air travel Acute sinus pain during flights, possibly due to sinus pressure or migraine. No migraine history. Optometrist suggested migraine. - Start Flonase or Nasonex, two squirts in each nostril once daily, starting tonight. - Take high dose ibuprofen (600-800 mg) before flight. - Consider Tylenol  in addition to ibuprofen if pain recurs during flight. - Consider sinus CT if symptoms persist or recur.    Return if symptoms worsen or fail to improve.    Dorothyann Byars, MD [facility]  Primary Care & Sports Medicine at [facility]

## 2024-01-01 ENCOUNTER — Ambulatory Visit: Payer: Self-pay | Admitting: Family Medicine

## 2024-01-01 DIAGNOSIS — R7989 Other specified abnormal findings of blood chemistry: Secondary | ICD-10-CM

## 2024-01-01 LAB — CMP14+EGFR
ALT: 114 IU/L — ABNORMAL HIGH (ref 0–32)
AST: 84 IU/L — ABNORMAL HIGH (ref 0–40)
Albumin: 4.6 g/dL (ref 3.8–4.9)
Alkaline Phosphatase: 83 IU/L (ref 49–135)
BUN/Creatinine Ratio: 10 (ref 9–23)
BUN: 7 mg/dL (ref 6–24)
Bilirubin Total: 0.4 mg/dL (ref 0.0–1.2)
CO2: 20 mmol/L (ref 20–29)
Calcium: 9.6 mg/dL (ref 8.7–10.2)
Chloride: 100 mmol/L (ref 96–106)
Creatinine, Ser: 0.73 mg/dL (ref 0.57–1.00)
Globulin, Total: 2.5 g/dL (ref 1.5–4.5)
Glucose: 77 mg/dL (ref 70–99)
Potassium: 4.1 mmol/L (ref 3.5–5.2)
Sodium: 138 mmol/L (ref 134–144)
Total Protein: 7.1 g/dL (ref 6.0–8.5)
eGFR: 95 mL/min/1.73 (ref >59)

## 2024-01-01 LAB — CBC
Hematocrit: 41 % (ref 34.0–46.6)
Hemoglobin: 14 g/dL (ref 11.1–15.9)
MCH: 34.9 pg — ABNORMAL HIGH (ref 26.6–33.0)
MCHC: 34.1 g/dL (ref 31.5–35.7)
MCV: 102 fL — ABNORMAL HIGH (ref 79–97)
Platelets: 322 x10E3/uL (ref 150–450)
RBC: 4.01 x10E6/uL (ref 3.77–5.28)
RDW: 12.2 % (ref 11.7–15.4)
WBC: 7.2 x10E3/uL (ref 3.4–10.8)

## 2024-01-01 LAB — LIPID PANEL
Chol/HDL Ratio: 3.6 ratio (ref 0.0–4.4)
Cholesterol, Total: 213 mg/dL — ABNORMAL HIGH (ref 100–199)
HDL: 60 mg/dL (ref >39)
LDL Chol Calc (NIH): 128 mg/dL — ABNORMAL HIGH (ref 0–99)
Triglycerides: 140 mg/dL (ref 0–149)
VLDL Cholesterol Cal: 25 mg/dL (ref 5–40)

## 2024-01-01 LAB — VITAMIN B12: Vitamin B-12: 933 pg/mL (ref 232–1245)

## 2024-01-01 NOTE — Progress Notes (Signed)
 Hi Gemini, the AST and ALT liver enzymes have shot upward compared to last year.  Liver is definitely showing some chronic stress so I would like to get an ultrasound for further evaluation.  Encouraged her to continue to work on avoiding alcohol.  Total cholesterol and LDL are elevated as well.  Just encouraged her to work on healthy Mediterranean diet and regular exercise to improve those numbers.  Hemoglobin looks good.  Vitamin B12 looks great.  The 10-year ASCVD risk score (Arnett DK, et al., 2019) is: 4.9%   Values used to calculate the score:     Age: 58 years     Clincally relevant sex: Female     Is Non-Hispanic African American: No     Diabetic: No     Tobacco smoker: Yes     Systolic Blood Pressure: 112 mmHg     Is BP treated: No     HDL Cholesterol: 60 mg/dL     Total Cholesterol: 213 mg/dL

## 2024-01-02 ENCOUNTER — Encounter: Payer: Self-pay | Admitting: Family Medicine

## 2024-01-02 NOTE — Progress Notes (Signed)
 Hi Martha Richardson, there is some joint space narrowing and some arthritis particularly behind the kneecap and the knee joint.  As well as on the outer part of the knee.  Initial treatment for arthritis is some physical therapy and occasional anti-inflammatory as needed.  Are you interested in PT or are you wanting to see a sports med doc?

## 2024-01-05 NOTE — Progress Notes (Signed)
 Please mail exer cise for patellofemoral syndrome

## 2024-01-21 ENCOUNTER — Ambulatory Visit

## 2024-01-21 DIAGNOSIS — R7989 Other specified abnormal findings of blood chemistry: Secondary | ICD-10-CM

## 2024-01-21 DIAGNOSIS — K7689 Other specified diseases of liver: Secondary | ICD-10-CM | POA: Diagnosis not present

## 2024-01-23 ENCOUNTER — Ambulatory Visit: Payer: Self-pay | Admitting: Family Medicine

## 2024-01-23 NOTE — Progress Notes (Signed)
 Hi Aurilla, ultrasound shows a approximately 2-1/2 x 3 cm complex cyst in the right hepatic lobe in the liver.  Also they noted increased echogenicity on the scan which can indicate an increased fat content in the liver.  This can eventually lead to scarring or cirrhosis.  I would like to recommend repeating the extra sound in 1 year just to keep an eye on the cyst though it does appear to look benign.

## 2024-02-05 ENCOUNTER — Other Ambulatory Visit: Payer: Self-pay | Admitting: Physician Assistant

## 2024-02-05 DIAGNOSIS — F419 Anxiety disorder, unspecified: Secondary | ICD-10-CM

## 2024-02-06 NOTE — Telephone Encounter (Signed)
 ALPRAZOLAM  Last OV: 12/31/23 Next OV: No appt scheduled Last RF: 10/29/23

## 2024-03-16 ENCOUNTER — Other Ambulatory Visit: Payer: Self-pay | Admitting: Family Medicine

## 2024-03-16 DIAGNOSIS — F419 Anxiety disorder, unspecified: Secondary | ICD-10-CM
